# Patient Record
Sex: Male | Born: 1950 | Race: Black or African American | Hispanic: No | Marital: Married | Smoking: Never smoker
Health system: Southern US, Community
[De-identification: ages and names within clinical notes are randomized; demographics above are authoritative.]

## PROBLEM LIST (undated history)

## (undated) DIAGNOSIS — E119 Type 2 diabetes mellitus without complications: Secondary | ICD-10-CM

## (undated) DIAGNOSIS — I10 Essential (primary) hypertension: Secondary | ICD-10-CM

---

## 1999-03-29 ENCOUNTER — Encounter: Payer: Self-pay | Admitting: *Deleted

## 1999-03-29 ENCOUNTER — Inpatient Hospital Stay (HOSPITAL_COMMUNITY): Admission: EM | Admit: 1999-03-29 | Discharge: 1999-03-31 | Payer: Self-pay | Admitting: Emergency Medicine

## 1999-03-30 ENCOUNTER — Encounter: Payer: Self-pay | Admitting: Internal Medicine

## 2003-05-15 ENCOUNTER — Emergency Department (HOSPITAL_COMMUNITY): Admission: EM | Admit: 2003-05-15 | Discharge: 2003-05-15 | Payer: Self-pay | Admitting: *Deleted

## 2004-06-03 ENCOUNTER — Ambulatory Visit: Payer: Self-pay | Admitting: Internal Medicine

## 2004-06-04 ENCOUNTER — Emergency Department (HOSPITAL_COMMUNITY): Admission: EM | Admit: 2004-06-04 | Discharge: 2004-06-04 | Payer: Self-pay | Admitting: Family Medicine

## 2005-04-03 ENCOUNTER — Emergency Department (HOSPITAL_COMMUNITY): Admission: EM | Admit: 2005-04-03 | Discharge: 2005-04-04 | Payer: Self-pay | Admitting: Emergency Medicine

## 2005-04-22 ENCOUNTER — Emergency Department (HOSPITAL_COMMUNITY): Admission: EM | Admit: 2005-04-22 | Discharge: 2005-04-22 | Payer: Self-pay | Admitting: Family Medicine

## 2005-12-28 ENCOUNTER — Inpatient Hospital Stay (HOSPITAL_COMMUNITY): Admission: EM | Admit: 2005-12-28 | Discharge: 2005-12-30 | Payer: Self-pay | Admitting: Emergency Medicine

## 2005-12-30 ENCOUNTER — Ambulatory Visit: Payer: Self-pay | Admitting: Cardiology

## 2005-12-30 ENCOUNTER — Encounter: Payer: Self-pay | Admitting: Cardiology

## 2006-08-14 ENCOUNTER — Encounter: Admission: RE | Admit: 2006-08-14 | Discharge: 2006-08-14 | Payer: Self-pay | Admitting: Family Medicine

## 2008-02-15 ENCOUNTER — Emergency Department (HOSPITAL_COMMUNITY): Admission: EM | Admit: 2008-02-15 | Discharge: 2008-02-15 | Payer: Self-pay | Admitting: Emergency Medicine

## 2008-05-16 ENCOUNTER — Emergency Department (HOSPITAL_COMMUNITY): Admission: EM | Admit: 2008-05-16 | Discharge: 2008-05-16 | Payer: Self-pay | Admitting: Emergency Medicine

## 2009-02-02 ENCOUNTER — Emergency Department: Payer: Self-pay | Admitting: Emergency Medicine

## 2009-12-18 ENCOUNTER — Emergency Department (HOSPITAL_COMMUNITY): Admission: EM | Admit: 2009-12-18 | Discharge: 2009-12-18 | Payer: Self-pay | Admitting: Emergency Medicine

## 2010-04-17 ENCOUNTER — Observation Stay: Payer: Self-pay | Admitting: Internal Medicine

## 2010-07-19 NOTE — Discharge Summary (Signed)
Jeremy Pacheco. [facility]  Patient:    Jeremy Pacheco, Jeremy Pacheco                         MRN: Adm. Date:  03/29/99 Disc. Date: 03/31/99 Attending:  Champ Pacheco. Jeremy Pacheco, M.D. [facility] CC:         Jeremy Pacheco. Jeremy Pacheco, M.D. [facility]                           Discharge Summary  PROCEDURE:  Exercise Cardiolite.  REASON FOR ADMISSION:  Mr. Jeremy Pacheco is a 60 year old male with reported history of  previous "small" MIs with previous cardiac catheterization in 1997 in [place]. is cardiac risk factors were notable FH of premature CAD and HTN.  He presented with gradual onset of chest pain with reported improvement with sublingual nitroglycerin.  The pain was described as a "pressure" with associated nausea and diaphoresis, but no dyspnea.  An EKG on admission was notable for a first degree AVB, but otherwise, within normal limits.  He was admitted for a rule-out of MI and further diagnostic evaluation.  LABORATORY DATA:  Cardiac enzymes:  CPK/MB negative x 2; Troponin I 0.04 (x 2).  Metabolic profile and CBC within normal limits.  Admission CXR:  NAD.  HOSPITAL COURSE:  Following admission, the patient ruled-out for MI with negative serial cardiac enzymes.  The plan was to proceed with exercise stress testing.  Prior to performing the stress test, however, the patient was noted to have developed second degree AVB type I overnight on Lopressor.  This was discontinued and no recurrent episodes were noted.  However, the patient did continue to maintain sinus bradycardia in the 50-60 BP range.  Exercise Cardiolite was performed, and the patient achieved 86% of PMHR; 14.3 METS. No chest pain was elicited during the procedure with the patient exercising a total of 12 minutes and 30 seconds of standard Bruce protocol.  Blood pressure rose from 166/110 baseline to 190/100 maximum.  No significant ST changes were noted, nor any ventricular ectopy.  Subsequent review of images revealed no ischemia  with question of minimal inferior attenuation/scar; EF 45%.  FINAL RECOMMENDATIONS AT THE TIME OF DISCHARGE:  Initiate low-dose Altace for management of HTN, in conjunction with home dose Adalat 30 mg q.d.  Proceed with outpatient 2-D echocardiogram for follow up of LVF.  Of note, the patient did have normal hemoglobin/hematocrit, but he did have low  MCV (69).  He will need outpatient followup of this microcytosis.  DISCHARGE MEDICATIONS: 1. Adalat XL 30 mg q.d. 2. Altace 2.5 mg q.d. 3. Coated aspirin 325 mg q.d. 4. Indocin 25 mg t.i.d. p.r.n.  DISCHARGE DIET:  The patient was instructed to maintain a low-fat/low-cholesterol diet.  FOLLOWUP:  He will need a followup BMP in one week given initiation of Altace.  The patient is instructed to schedule followup appointments with his primary care physician, Dr. Lemmie Pacheco. Fiery, in the following 2-3 weeks.  Additionally, he is to call and schedule a followup appointment with Dr. Beckie Pacheco in approximately 4 weeks. He will need an outpatient 2-D echocardiogram.  DISCHARGE DIAGNOSES: 1. Nonischemic chest pain.    a. Exercise Cardiolite on March 30, 1999.    b. History of remote myocardial infarction. 2. Arrhythmia.  Second degree A-V block type I Jeremy Pacheco), Lopressor. 3. Hypertension. 4. Family history of premature coronary artery disease. 5. Microcytosis. DD:  03/31/99 TD:  03/31/99 Job: [id number]

## 2010-07-19 NOTE — H&P (Signed)
NAME:  Jeremy Pacheco, Jeremy Pacheco                ACCOUNT NO.:  [id number]   MEDICAL RECORD NO.:  [id number]          PATIENT TYPE:  INP   LOCATION:  1410                         FACILITY:  [facility]   PHYSICIAN:  Sherryl Manges, M.D.  DATE OF BIRTH:  [birth year]   DATE OF ADMISSION:  12/28/2005  DATE OF DISCHARGE:                                HISTORY & PHYSICAL   CHIEF COMPLAINT:  Fatigue, increased thirst and urinary frequency x2 weeks,  also blurred vision x2 days.   HISTORY OF PRESENT ILLNESS:  This is a 60 year old male. For past medical  history, see below.  According to the patient, for the past 2 weeks, he has  felt very thirsty and has had increased urinary frequency, possible dysuria  and also felt very weak and tired.  He has an occasional dry cough, denies  chest pain or fever.  On December 27, 2005, his vision became quite blurred  and his tongue felt swollen.  He discussed this with his brother who has  diabetes, and his brother suggested that he go to the emergency department,  which he did on December 28, 2005.  On arrival in the ED, his blood glucose  was found to be 804.   PAST MEDICAL HISTORY:  1. Coronary artery disease, status post MI at age 89 years, i.e. 31,      status post PCI and stent.  2. Status post negative exercise Cardiolite in January 2001 (Dr. Cristopher Peru).  3. Arrhythmia, with second degree heart block ([place]) in January      2001.  4. Hypertension.  5. Microcytosis.  6. CHF in 1995, in [place], [place].  7. Dyslipidemia.   MEDICATIONS:  1. Clonidine 0.2 mg p.o. b.i.d.  2. Enalapril 20 mg p.o. b.i.d.  3. Aspirin 81 mg p.o. daily.   ALLERGIES:  NO KNOWN DRUG ALLERGIES.   SOCIAL HISTORY:  The patient used to work as a Training and development officer, however, he is now  disabled, secondary to coronary artery disease and ischemic cardiomyopathy.  He is married and has five offspring.  He has three brothers, one of whom  has coronary artery disease, status post CABG  with four-vessel disease,  another is diabetic, the third is healthy.  He also has two sisters, both of  whom have diabetes mellitus and hypertension.  The patient is a nonsmoker,  drinks alcohol only very occasionally.  He has no history of drug abuse.  His mother is deceased at age 55 years, status post CVA.  His father died  status post MI, at age 40 years.   PHYSICAL EXAMINATION:  VITALS:  Temperature 98.2, pulse 71 per minute and  regular, respiratory 18, BP 152/92 mmHg, pulse oximeter 100% on room air.  GENERAL:  The patient appears quite comfortable, not in obvious acute  distress, alert, communicative, not short of breath at rest.  HEENT:  No clinical pallor or jaundice.  No conjunctival injection.  Throat  is quite dry.  NECK:  Neck is supple.  JVP not seen.  No palpable lymphadenopathy.  No  palpable goiter.  CHEST:  Clinically clear to auscultation.  No wheezes or crackles.  HEART:  Sounds 1 and 2 heard.  Normal, regular, no murmurs.  ABDOMEN:  Full, soft and nontender.  There is no palpable organomegaly.  No  palpable masses.  Normal bowel sounds.  EXTREMITIES:  Lower extremity examination.  No pitting edema.  Palpable  peripheral pulses.  MUSCULOSKELETAL:  Unremarkable.  NEUROLOGIC:  No focal neurologic deficits on gross examination.   LABORATORY DATA AND X-RAY FINDINGS:  CBC with WBC 5.2, hemoglobin 16.1,  hematocrit 50.4, platelets 204, MCV 72.5.  Electrolytes with sodium 130,  potassium 5.2, chloride 92, CO2 24, BUN 22, creatinine 1.4, glucose 804.  ABG on room air with pH 7.373, pCO2 37.0, pO2 97.3, HCO3 21.1.  Urinalysis  is negative.   ASSESSMENT AND PLAN:  1. Uncontrolled diabetes mellitus.  New diagnosis.  We shall admit the      patient for close monitoring, institute Glucommander protocol for now,      and transition to sliding scale insulin and possibly scheduled insulin,      when glycemia is controlled. For completeness, will do septic screen,       including blood cultures and chest x-ray.  Urinalysis is negative.  We      shall also do HbA1c.   1. Hypertension.  This is uncontrolled.  We shall continue pre-admission      antihypertensives, and monitor.   1. History of congestive heart failure.  Certainly, the patient is not      clinically in congestive heart failure at this time.   1. History of coronary artery disease.  This is asymptomatic, however, we      shall do EKG and cycle cardiac enzymes for completeness.   1. Dyslipidemia.  Check lipid profile and TSH.   Further management will depend on clinical course.      Sherryl Manges, M.D.  Electronically Signed     CO/MEDQ  D:  12/28/2005  T:  12/29/2005  Job:  [id number]   cc:   Vickki Hearing, M.D.  Fax: [phone]

## 2010-07-19 NOTE — Discharge Summary (Signed)
NAME:  Jeremy Pacheco, Jeremy Pacheco                ACCOUNT NO.:  [id number]   MEDICAL RECORD NO.:  [id number]          PATIENT TYPE:  INP   LOCATION:  [place]                         FACILITY:  [facility]   PHYSICIAN:  Jeremy Faster, MD        DATE OF BIRTH:  1950-08-13   DATE OF ADMISSION:  12/28/2005  DATE OF DISCHARGE:  12/30/2005                                 DISCHARGE SUMMARY   PRIMARY CARE PHYSICIAN:  Jeremy Pacheco, M.D.   HISTORY OF PRESENT ILLNESS:  Jeremy Pacheco is a 60 year old gentleman with a  past history significant for coronary artery disease, status post myocardial  infarction in 1997, status post stent, hypertension, history of congestive  heart failure in 1995 and dyslipidemia, who was admitted with fatigue and  increased urinary frequency for the last couple of weeks.  On the day of  admission, the patient's vision became blurred and he discussed this with  his brother who has diabetes and he asked him to come to the ER.  In the ER,  his blood sugar was found to be 804 on presentation.   HOSPITAL COURSE:  1. New onset diabetes mellitus.  This is a gentleman who did not have a      past history of diabetes but with strong family history of diabetes      mellitus who comes in with new onset of diabetes.  His blood sugar on      presentation was elevated at 804.  He did not have anion gap.  He was      not  acidotic on admission.  He was started on the insulin drip      protocol to bring his blood sugars down and then he was started on      Lantus of 10 units daily.  His blood sugars have been controlled.  I      suspect that he has type 2 diabetes mellitus considering his age but I      also suspect that he may need insulin for a short term to control his      blood sugars because I suspect he may be insulin deficit from his      pancreas being burned out.  We are planning to discharge him on Lantus      of 10 units at bedtime along with the Glucophage which was started in      the hospital.   He was also told to check his blood sugars three times a      day and make a note of it and take it with him when he sees Dr. Juventino Pacheco.      He was given diabetic education while he was in the hospital and he is      being referred to outpatient diabetic treatment program.  We can      consider doing the antibody against insulin as an outpatient if needed      to make the diagnosis.  2. Hypertension.  His blood pressure has been controlled.  He put him back  on his home medications.  3. History of coronary artery disease.  He does not have any chest pain or      any breathing difficulty during this admission.   DISCHARGE MEDICATIONS:  1. Lantus 10 units subcu at bedtime.  2. Glucophage 500 mg twice daily.  3. Clonidine 0.2 mg twice daily.  4. Enalapril 20 mg twice daily.  5. Aspirin 81 mg.   DISPOSITION:  He will be discharged home in a stable condition.   FOLLOWUP:  He will follow up with his primary care doctor, Dr. Ihor Gully,  in one week.  He will be referred to outpatient diabetic treatment program.  He was taught how to take the insulin shots while he was in the hospital and  he was also given diabetic education by a dietician.  He was also  recommended to take low calorie diet by dietician.      Jeremy Faster, MD  Electronically Signed     PKN/MEDQ  D:  12/30/2005  T:  12/30/2005  Job:  [id number]   cc:   Jeremy Pacheco, M.D.  Fax: [phone]

## 2011-10-02 DEATH — deceased

## 2012-05-27 ENCOUNTER — Emergency Department: Payer: Self-pay | Admitting: Emergency Medicine

## 2012-05-27 LAB — COMPREHENSIVE METABOLIC PANEL
Alkaline Phosphatase: 62 U/L (ref 50–136)
Anion Gap: 6 — ABNORMAL LOW (ref 7–16)
BUN: 15 mg/dL (ref 7–18)
Calcium, Total: 9.1 mg/dL (ref 8.5–10.1)
Chloride: 105 mmol/L (ref 98–107)
Co2: 25 mmol/L (ref 21–32)
EGFR (Non-African Amer.): >60
Glucose: 82 mg/dL (ref 65–99)
Osmolality: 272 (ref 275–301)
SGOT(AST): 21 U/L (ref 15–37)
SGPT (ALT): 25 U/L (ref 12–78)
Sodium: 136 mmol/L (ref 136–145)
Total Protein: 8.4 g/dL — ABNORMAL HIGH (ref 6.4–8.2)

## 2012-05-27 LAB — CBC
HCT: 40.9 % (ref 40.0–52.0)
HGB: 13.2 g/dL (ref 13.0–18.0)
MCH: 22.8 pg — ABNORMAL LOW (ref 26.0–34.0)
MCHC: 32.3 g/dL (ref 32.0–36.0)
MCV: 71 fL — ABNORMAL LOW (ref 80–100)
Platelet: 248 10*3/uL (ref 150–440)
RBC: 5.79 10*6/uL (ref 4.40–5.90)
RDW: 15 % — ABNORMAL HIGH (ref 11.5–14.5)
WBC: 3.9 10*3/uL (ref 3.8–10.6)

## 2012-05-27 LAB — TROPONIN I: Troponin-I: <0.02 ng/mL

## 2012-06-03 ENCOUNTER — Emergency Department: Payer: Self-pay | Admitting: Emergency Medicine

## 2014-04-27 ENCOUNTER — Emergency Department: Payer: Self-pay | Admitting: Emergency Medicine

## 2018-08-29 ENCOUNTER — Other Ambulatory Visit: Payer: Self-pay

## 2018-08-29 ENCOUNTER — Ambulatory Visit (HOSPITAL_COMMUNITY)
Admission: EM | Admit: 2018-08-29 | Discharge: 2018-08-29 | Disposition: A | Discharge status: Home / Self Care | Payer: Medicare Other

## 2018-08-29 ENCOUNTER — Encounter (HOSPITAL_COMMUNITY): Payer: Self-pay | Admitting: *Deleted

## 2018-08-29 DIAGNOSIS — R109 Unspecified abdominal pain: Secondary | ICD-10-CM

## 2018-08-29 HISTORY — DX: Type 2 diabetes mellitus without complications: E11.9

## 2018-08-29 HISTORY — DX: Essential (primary) hypertension: I10

## 2018-08-29 LAB — POCT URINALYSIS DIP (DEVICE)
Bilirubin Urine: NEGATIVE
Glucose, UA: 100 mg/dL — AB
Ketones, ur: NEGATIVE mg/dL
Leukocytes,Ua: NEGATIVE
Nitrite: NEGATIVE
Protein, ur: 30 mg/dL — AB
Specific Gravity, Urine: 1.025 (ref 1.005–1.030)
Urobilinogen, UA: 0.2 mg/dL (ref 0.0–1.0)
pH: 7 (ref 5.0–8.0)

## 2018-08-29 MED ORDER — INDOMETHACIN 50 MG PO CAPS: 50.0000 mg | ORAL_CAPSULE | Freq: Two times a day (BID) | ORAL | 0 refills | Status: DC

## 2018-08-29 MED ORDER — KETOROLAC TROMETHAMINE 30 MG/ML IJ SOLN
INTRAMUSCULAR | Status: AC
  Filled 2018-08-29: qty 1

## 2018-08-29 MED ORDER — KETOROLAC TROMETHAMINE 30 MG/ML IJ SOLN
30.0000 mg | Freq: Once | INTRAMUSCULAR | Status: AC
  Administered 2018-08-29: 30 mg via INTRAMUSCULAR

## 2018-08-29 MED ORDER — TAMSULOSIN HCL 0.4 MG PO CAPS: 0.4000 mg | ORAL_CAPSULE | Freq: Every day | ORAL | 0 refills | Status: AC

## 2018-08-29 NOTE — Discharge Instructions (Signed)
Your urine did show blood which is suggestive of a kidney stone contributing to your back pain. Please strain all your urine to catch any stone We gave you a shot of Toradol to help with your pain prior to leaving Continue to use indomethacin twice a day, do not take until tonight since we gave you a shot Take tamsulosin/Flomax daily to help open up ureter to pass any stone  Please also be sure to drink plenty of fluids to flush any stone and help with any underlying constipation Incorporating fiber into diet  Please follow-up if developing worsening pain, fever, vomiting, not passing any stone in 4 to 5 days, symptoms worsening

## 2018-08-29 NOTE — ED Triage Notes (Signed)
Reports starting with right abd pain yesterday; took Fleets enema for constipation.  Has had adequate BM.  States constant pain has now wrapped around to right flank area.  Denies n//v, fevers.  Denies injury.

## 2018-08-30 NOTE — ED Provider Notes (Signed)
[place]    CSN: [id number] Arrival date & time: 08/29/18  1201      History   Chief Complaint Chief Complaint  Patient presents with  . Flank Pain    HPI Jeremy Pacheco is a 68 y.o. male history of DM type II, hypertension, presenting today for evaluation of right-sided flank pain.  Patient states that his symptoms began yesterday.  Initially attributed this to constipation.  He took a fleets enema had a normal sized bowel movement.  States pain did not improve with this.  States that he has regular bowel movements and denies straining.  He has had not had associated nausea vomiting or fevers.  Denies injury or increase in activity that may have triggered pain.  He is concerned about kidney stone.  Denies previous history of kidney stones.  Denies any change in urinary symptoms.  Denies increased frequency, urgency, hematuria or dysuria.  HPI  Past Medical History:  Diagnosis Date  . Diabetes mellitus without complication ([place])   . Hypertension     There are no active problems to display for this patient.   History reviewed. No pertinent surgical history.     Home Medications    Prior to Admission medications   Medication Sig Start Date End Date Taking? Authorizing Provider  AMLODIPINE BENZOATE PO Take by mouth.   Yes [provider]  LOSARTAN POTASSIUM-HCTZ PO Take by mouth.   Yes [provider]  METFORMIN HCL PO Take by mouth.   Yes [provider]  indomethacin (INDOCIN) 50 MG capsule Take 1 capsule (50 mg total) by mouth 2 (two) times daily with a meal. 08/29/18   Juandedios Dudash C, PA-C  tamsulosin (FLOMAX) 0.4 MG CAPS capsule Take 1 capsule (0.4 mg total) by mouth daily for 10 days. 08/29/18 09/08/18  Sotiria Keast, Elesa Hacker, PA-C    Family History Family History  Problem Relation Age of Onset  . Heart disease Mother   . Diabetes Mother   . Heart disease Father   . Diabetes Father     Social History Social History    Tobacco Use  . Smoking status: Never Smoker  . Smokeless tobacco: Never Used  Substance Use Topics  . Alcohol use: Not Currently    Frequency: Never  . Drug use: Never     Allergies   Patient has no known allergies.   Review of Systems Review of Systems  Constitutional: Negative for fever.  HENT: Negative for sore throat.   Respiratory: Negative for shortness of breath.   Cardiovascular: Negative for chest pain.  Gastrointestinal: Negative for abdominal pain, constipation, diarrhea, nausea and vomiting.  Genitourinary: Positive for flank pain. Negative for difficulty urinating, discharge, dysuria, frequency, penile pain, penile swelling, scrotal swelling and testicular pain.  Skin: Negative for rash.  Neurological: Negative for dizziness, light-headedness and headaches.     Physical Exam Triage Vital Signs ED Triage Vitals  Enc Vitals Group     BP 08/29/18 1318 (!) 187/86     Pulse Rate 08/29/18 1318 (!) 59     Resp 08/29/18 1318 20     Temp 08/29/18 1318 98.9 F (37.2 C)     Temp Source 08/29/18 1318 Oral     SpO2 08/29/18 1318 99 %     Weight --      Height --      Head Circumference --      Peak Flow --      Pain Score 08/29/18 1320 10  Pain Loc --      Pain Edu? --      Excl. in [place]? --    No data found.  Updated Vital Signs BP (!) 187/86   Pulse (!) 59   Temp 98.9 F (37.2 C) (Oral)   Resp 20   SpO2 99%   Visual Acuity Right Eye Distance:   Left Eye Distance:   Bilateral Distance:    Right Eye Near:   Left Eye Near:    Bilateral Near:     Physical Exam Vitals signs and nursing note reviewed.  Constitutional:      Appearance: He is well-developed.  HENT:     Head: Normocephalic and atraumatic.     Mouth/Throat:     Comments: Oral mucosa pink and moist, no tonsillar enlargement or exudate. Posterior pharynx patent and nonerythematous, no uvula deviation or swelling. Normal phonation.  Eyes:     Conjunctiva/sclera: Conjunctivae normal.   Neck:     Musculoskeletal: Neck supple.  Cardiovascular:     Rate and Rhythm: Normal rate and regular rhythm.     Heart sounds: No murmur.  Pulmonary:     Effort: Pulmonary effort is normal. No respiratory distress.     Breath sounds: Normal breath sounds.     Comments: Breathing comfortably at rest, CTABL, no wheezing, rales or other adventitious sounds auscultated Abdominal:     Palpations: Abdomen is soft.     Tenderness: There is no abdominal tenderness.     Comments: Soft, nondistended, nontender to light and deep palpation throughout entire abdomen  Musculoskeletal:     Comments: Nontender to palpation of cervical, thoracic and lumbar spine midline, nonreproducible pain to palpation of bilateral lumbar and lower thoracic musculature, no CVA tenderness bilaterally  Skin:    General: Skin is warm and dry.  Neurological:     Mental Status: He is alert.      UC Treatments / Results  Labs (all labs ordered are listed, but only abnormal results are displayed) Labs Reviewed  POCT URINALYSIS DIP (DEVICE) - Abnormal; Notable for the following components:      Result Value   Glucose, UA 100 (*)    Hgb urine dipstick SMALL (*)    Protein, ur 30 (*)    All other components within normal limits    EKG None  Radiology No results found.  Procedures Procedures (including critical care time)  Medications Ordered in UC Medications  ketorolac (TORADOL) 30 MG/ML injection 30 mg (30 mg Intramuscular Given 08/29/18 1420)  ketorolac (TORADOL) 30 MG/ML injection (has no administration in time range)    Initial Impression / Assessment and Plan / UC Course  I have reviewed the triage vital signs and the nursing notes.  Pertinent labs & imaging results that were available during my care of the patient were reviewed by me and considered in my medical decision making (see chart for details).     Patient does have small hemoglobin on UA, no previous history of kidney stone.  Offered  x-ray of abdomen to check for possible constipation versus radiopaque stone.  Discussed not all stones are visible on x-ray.  Patient declined this and wished to proceed with treatment for kidney stone with continued monitoring.  Provided Toradol for pain prior to discharge.  Flomax daily, strainer provided advised to strain all urine.  May continue with indomethacin at home.  Drink plenty of fluids.  Follow-up if pain not resolving or worsening or changing.Discussed strict return precautions. Patient verbalized understanding  and is agreeable with plan.  Final Clinical Impressions(s) / UC Diagnoses   Final diagnoses:  Right flank pain     Discharge Instructions     Your urine did show blood which is suggestive of a kidney stone contributing to your back pain. Please strain all your urine to catch any stone We gave you a shot of Toradol to help with your pain prior to leaving Continue to use indomethacin twice a day, do not take until tonight since we gave you a shot Take tamsulosin/Flomax daily to help open up ureter to pass any stone  Please also be sure to drink plenty of fluids to flush any stone and help with any underlying constipation Incorporating fiber into diet  Please follow-up if developing worsening pain, fever, vomiting, not passing any stone in 4 to 5 days, symptoms worsening   ED Prescriptions    Medication Sig Dispense Auth. Provider   tamsulosin (FLOMAX) 0.4 MG CAPS capsule Take 1 capsule (0.4 mg total) by mouth daily for 10 days. 10 capsule Lerlene Treadwell C, PA-C   indomethacin (INDOCIN) 50 MG capsule Take 1 capsule (50 mg total) by mouth 2 (two) times daily with a meal. 16 capsule Casey Fye C, PA-C     Controlled Substance Prescriptions [place] Controlled Substance Registry consulted? Not Applicable   Janith L[place], [place] 08/30/18 1729

## 2019-09-22 ENCOUNTER — Other Ambulatory Visit: Payer: Self-pay

## 2019-09-22 ENCOUNTER — Encounter (HOSPITAL_COMMUNITY): Payer: Self-pay

## 2019-09-22 ENCOUNTER — Ambulatory Visit (HOSPITAL_COMMUNITY)
Admission: EM | Admit: 2019-09-22 | Discharge: 2019-09-22 | Disposition: A | Discharge status: Home / Self Care | Payer: Medicare Other | Attending: Emergency Medicine | Admitting: Emergency Medicine

## 2019-09-22 NOTE — ED Triage Notes (Signed)
Pt presents with right side rib & flank pain after a fall of about 6 ft off of his ladder at home today; pt states it hurts when he breathes in & out and move around & is unrelieved with OTC medication.

## 2019-09-23 ENCOUNTER — Ambulatory Visit (HOSPITAL_COMMUNITY): Payer: Self-pay

## 2020-05-04 LAB — EXTERNAL GENERIC LAB PROCEDURE

## 2020-05-23 LAB — EXTERNAL GENERIC LAB PROCEDURE

## 2021-04-18 LAB — EXTERNAL GENERIC LAB PROCEDURE

## 2021-05-03 ENCOUNTER — Other Ambulatory Visit: Payer: Self-pay | Admitting: Nephrology

## 2021-05-03 DIAGNOSIS — E1122 Type 2 diabetes mellitus with diabetic chronic kidney disease: Secondary | ICD-10-CM

## 2021-05-03 DIAGNOSIS — I129 Hypertensive chronic kidney disease with stage 1 through stage 4 chronic kidney disease, or unspecified chronic kidney disease: Secondary | ICD-10-CM

## 2021-05-03 DIAGNOSIS — R3129 Other microscopic hematuria: Secondary | ICD-10-CM

## 2021-05-03 DIAGNOSIS — N184 Chronic kidney disease, stage 4 (severe): Secondary | ICD-10-CM

## 2021-05-03 DIAGNOSIS — N200 Calculus of kidney: Secondary | ICD-10-CM

## 2021-05-03 DIAGNOSIS — I2510 Atherosclerotic heart disease of native coronary artery without angina pectoris: Secondary | ICD-10-CM

## 2021-05-03 DIAGNOSIS — D631 Anemia in chronic kidney disease: Secondary | ICD-10-CM

## 2021-05-03 DIAGNOSIS — N189 Chronic kidney disease, unspecified: Secondary | ICD-10-CM

## 2021-05-07 ENCOUNTER — Other Ambulatory Visit: Payer: Medicare Other

## 2021-05-09 ENCOUNTER — Other Ambulatory Visit: Payer: Self-pay | Admitting: Nephrology

## 2021-05-09 ENCOUNTER — Other Ambulatory Visit (HOSPITAL_COMMUNITY): Payer: Self-pay | Admitting: Nephrology

## 2021-05-10 ENCOUNTER — Ambulatory Visit
Admission: RE | Admit: 2021-05-10 | Discharge: 2021-05-10 | Disposition: A | Discharge status: Home / Self Care | Payer: Medicare Other | Source: Ambulatory Visit | Attending: Nephrology | Admitting: Nephrology

## 2021-05-10 ENCOUNTER — Other Ambulatory Visit: Payer: Self-pay

## 2021-05-10 DIAGNOSIS — D631 Anemia in chronic kidney disease: Secondary | ICD-10-CM

## 2021-05-10 DIAGNOSIS — N200 Calculus of kidney: Secondary | ICD-10-CM

## 2021-05-10 DIAGNOSIS — I129 Hypertensive chronic kidney disease with stage 1 through stage 4 chronic kidney disease, or unspecified chronic kidney disease: Secondary | ICD-10-CM

## 2021-05-10 DIAGNOSIS — N184 Chronic kidney disease, stage 4 (severe): Secondary | ICD-10-CM

## 2021-05-10 DIAGNOSIS — R3129 Other microscopic hematuria: Secondary | ICD-10-CM

## 2021-05-10 DIAGNOSIS — E1122 Type 2 diabetes mellitus with diabetic chronic kidney disease: Secondary | ICD-10-CM

## 2021-05-10 DIAGNOSIS — I2510 Atherosclerotic heart disease of native coronary artery without angina pectoris: Secondary | ICD-10-CM

## 2021-05-10 DIAGNOSIS — N189 Chronic kidney disease, unspecified: Secondary | ICD-10-CM

## 2021-05-20 ENCOUNTER — Encounter (HOSPITAL_COMMUNITY)
Admission: RE | Admit: 2021-05-20 | Discharge: 2021-05-20 | Disposition: A | Discharge status: Home / Self Care | Payer: Medicare Other | Source: Ambulatory Visit | Attending: Nephrology | Admitting: Nephrology

## 2021-05-20 ENCOUNTER — Other Ambulatory Visit: Payer: Self-pay

## 2021-05-20 MED ORDER — TECHNETIUM TC 99M SESTAMIBI GENERIC - CARDIOLITE
25.2000 | Freq: Once | INTRAVENOUS | Status: AC | PRN
  Administered 2021-05-20: 25.2 via INTRAVENOUS

## 2021-06-05 ENCOUNTER — Ambulatory Visit: Payer: Self-pay | Admitting: Urology

## 2021-06-25 NOTE — Progress Notes (Addendum)
? ?06/26/2021 ?8:19 AM  ? ?Jeremy Pacheco ?August 06, 1950 ?[id number] ? ?Referring provider:  ?Bernerd Limbo, MD ?[address] ?[address] ?[place],  [place] [address] ?Chief Complaint  ?Patient presents with  ? Nephrolithiasis  ? ? ?HPI: ?Jeremy Pacheco is a 71 y.o.male who presents today for further evaluation of nephrolithiasis and right hydronephrosis. ? ?His creatinine has been trending up since 2019. In 2019 his creatinine was 1.38, it increased in 2020 to 1.83 then decreased back down to 1.38. It trended back up to 1.90 in 2021 then to 2.17 in 2022. His most recent creatinine was 2.60 on 04/04/2021.   ? ?He underwent a RUS on 05/10/2021 to further evaluate CKD. It visualized moderate to marked right hydronephrosis along with cortical thinning, possibly suggesting longstanding obstruction of right ureter. There is 8 mm calcific density in the urinary bladder which may suggest recently passed ureteral calculus or calculus at the right ureterovesical junction. ? ?He had a previous CT abdomen and pelvis on 2020 that visualized nonobstructing right renal calculi, small right cortical renal cysts, incompletely distended urinary bladder with nonspecific mild-to-moderate wall thickening, diverticulosis without evidence of acute diverticulitis.  ? ?UA today shows few red blood cells and white blood cells.  ? ?He reports that he has never had urinary issues he was told in ~2020 that he had kidney stones at time he had  flank pain. He also reports that he had a cystoscopy for blood in his urine and was told he had BPH in [place]. He has a little bit of urinary frequency but no other urinary symptoms.   ? ?PMH: ?Past Medical History:  ?Diagnosis Date  ? Diabetes mellitus without complication ([place])   ? Hypertension   ? ? ?Surgical History: ?No past surgical history on file. ? ?Home Medications:  ?Allergies as of 06/26/2021   ? ?   Reactions  ? Penicillins Hives  ? childhood  ? Lisinopril Cough  ? ?  ? ?  ?Medication List  ?   ? ?  ? Accurate as of June 26, 2021 11:59 PM. If you have any questions, ask your nurse or doctor.  ?  ?  ? ?  ? ?STOP taking these medications   ? ?LOSARTAN POTASSIUM-HCTZ PO ?Stopped by: Hollice Espy, MD ?  ? ?  ? ?TAKE these medications   ? ?AMLODIPINE BENZOATE PO ?Take by mouth. ?  ?indomethacin 50 MG capsule ?Commonly known as: INDOCIN ?Take 1 capsule (50 mg total) by mouth 2 (two) times daily with a meal. ?  ?METFORMIN HCL PO ?Take by mouth. ?  ? ?  ? ? ?Allergies:  ?Allergies  ?Allergen Reactions  ? Penicillins Hives  ?  childhood  ? Lisinopril Cough  ? ? ?Family History: ?Family History  ?Problem Relation Age of Onset  ? Heart disease Mother   ? Diabetes Mother   ? Heart disease Father   ? Diabetes Father   ? ? ?Social History:  reports that he has never smoked. He has never used smokeless tobacco. He reports that he does not currently use alcohol. He reports that he does not use drugs. ? ? ?Physical Exam: ?BP (!) 174/106   Pulse 68   Ht 5\' 10"  (1.778 m)   Wt 204 lb (92.5 kg)   BMI 29.27 kg/m?   ?Constitutional:  Alert and oriented, No acute distress. ?HEENT: [place] AT, moist mucus membranes.  Trachea midline, no masses. ?Cardiovascular: No clubbing, cyanosis, or edema. ?Respiratory: Normal respiratory effort, no increased  work of breathing. ?Skin: No rashes, bruises or suspicious lesions. ?Neurologic: Grossly intact, no focal deficits, moving all 4 extremities. ?Psychiatric: Normal mood and affect. ? ?Laboratory Data: ?Lab Results  ?Component Value Date  ? CREATININE 1.28 05/27/2012  ? ? ?Urinalysis ?Few WBCs and Few RBCs.  ? ?Pertinent Imaging: ?CLINICAL DATA:  Chronic kidney disease ?  ?EXAM: ?RENAL / URINARY TRACT ULTRASOUND COMPLETE ?  ?COMPARISON:  None. ?  ?FINDINGS: ?Right Kidney: ?  ?Renal measurements: 11.9 x 5.7 x 6.7 = volume: 237 mL. There is ?moderate to marked hydronephrosis. There is cortical thinning. There ?is 1.9 cm cyst in the upper pole. Ureters are not adequately ?visualized for  evaluation. ?  ?Left Kidney: ?  ?Renal measurements: 11.4 x 6.7 x 5.4 cm = volume: 210.7 mL. There is ?no hydronephrosis. There is increased cortical echogenicity. ?  ?Bladder: ?  ?There is 8 mm calcific density in the posterior bladder with ?acoustic shadowing suggesting calculus in the lumen of the bladder ?or at the right ureterovesical junction. ?  ?Other: ?  ?None. ?  ?IMPRESSION: ?There is moderate to marked right hydronephrosis along with cortical ?thinning, possibly suggesting longstanding obstruction of right ?ureter. There is 8 mm calcific density in the urinary bladder which ?may suggest recently passed ureteral calculus or calculus at the ?right ureterovesical junction. ?  ?There is increased cortical echogenicity in the left kidney ?suggesting medical renal disease. ?  ?  ?Electronically Signed ?  By: Elmer Picker M.D. ?  On: 05/10/2021 20:51 ?  ?I have personally reviewed the images and agree with radiologist interpretation.  ? ? ?Assessment & Plan:   ? ?Nephrolithiasis / right hydronephrosis / right renal atrophy ?- Will further evaluate with CT renal stone study to better visualize if there is a stone burden.  ?-RUS concerning for longstanding obstruction ? ?2. Microscopic hematuria  ?- Likely secondary to #1 ?- Will send urine for culture to rule out any infections  ? ?3. CKD, stage 3 ?-likely has an obstructive component contributing to worsening renal function ?-discussed the importance of preservation of nephrons, depending on the chronicity of the obstruction which appears to be fairly long, in the last function in the kidney.  After the obstruction is relieved, we will may have to assess the overall function of the kidney with nuclear medicine scan. ? ?F/u Ct stone, will expedite  ? ?Conley Rolls as a scribe for Hollice Espy, MD.,have documented all relevant documentation on the behalf of Hollice Espy, MD,as directed by  Hollice Espy, MD while in the presence of Hollice Espy, MD. ? ?I have reviewed the above documentation for accuracy and completeness, and I agree with the above.  ? ?Hollice Espy, MD ? ? ?[facility] ?[address], [address] ?[place], [place] [address] ?[phone] ?

## 2021-06-26 ENCOUNTER — Encounter: Payer: Self-pay | Admitting: Urology

## 2021-06-26 ENCOUNTER — Ambulatory Visit: Payer: Medicare Other | Admitting: Urology

## 2021-06-26 VITALS — BP 174/106 | HR 68 | Ht 70.0 in | Wt 204.0 lb

## 2021-06-26 DIAGNOSIS — R1032 Left lower quadrant pain: Secondary | ICD-10-CM

## 2021-06-26 DIAGNOSIS — R3129 Other microscopic hematuria: Secondary | ICD-10-CM | POA: Diagnosis not present

## 2021-06-26 DIAGNOSIS — N200 Calculus of kidney: Secondary | ICD-10-CM

## 2021-06-26 DIAGNOSIS — N183 Chronic kidney disease, stage 3 unspecified: Secondary | ICD-10-CM | POA: Diagnosis not present

## 2021-06-26 LAB — URINALYSIS, COMPLETE
Bilirubin, UA: NEGATIVE
Ketones, UA: NEGATIVE
Leukocytes,UA: NEGATIVE
Nitrite, UA: NEGATIVE
Specific Gravity, UA: 1.025 (ref 1.005–1.030)
Urobilinogen, Ur: 0.2 mg/dL (ref 0.2–1.0)
pH, UA: 6 (ref 5.0–7.5)

## 2021-06-26 LAB — MICROSCOPIC EXAMINATION

## 2021-07-01 LAB — CULTURE, URINE COMPREHENSIVE

## 2021-07-02 ENCOUNTER — Telehealth: Payer: Self-pay | Admitting: *Deleted

## 2021-07-02 MED ORDER — NITROFURANTOIN MONOHYD MACRO 100 MG PO CAPS: 100.0000 mg | ORAL_CAPSULE | Freq: Two times a day (BID) | ORAL | 0 refills | Status: DC

## 2021-07-02 NOTE — Telephone Encounter (Addendum)
Patient informed, sent in RX to [facility]. Voiced understanding.  ? ?----- Message from Hollice Espy, MD sent at 07/01/2021  5:20 PM EDT ----- ?Very low colony count however in setting of possible urinary obstruction and abnormal urinalysis, I like to go ahead and treat this.  Please treated with nitrofurantoin 100 mg twice daily for 10 days. ? ?Hollice Espy, MD ? ?

## 2021-07-09 ENCOUNTER — Ambulatory Visit
Admission: RE | Admit: 2021-07-09 | Discharge: 2021-07-09 | Disposition: A | Discharge status: Home / Self Care | Payer: Medicare Other | Source: Ambulatory Visit | Attending: Urology | Admitting: Urology

## 2021-07-09 DIAGNOSIS — N200 Calculus of kidney: Secondary | ICD-10-CM | POA: Insufficient documentation

## 2021-07-09 DIAGNOSIS — R1032 Left lower quadrant pain: Secondary | ICD-10-CM | POA: Insufficient documentation

## 2021-07-10 ENCOUNTER — Other Ambulatory Visit: Payer: Self-pay | Admitting: Urology

## 2021-07-10 ENCOUNTER — Ambulatory Visit: Payer: Medicare Other | Admitting: Urology

## 2021-07-10 ENCOUNTER — Encounter: Payer: Self-pay | Admitting: Urology

## 2021-07-10 ENCOUNTER — Telehealth: Payer: Self-pay

## 2021-07-10 VITALS — BP 181/99 | HR 100 | Ht 70.0 in | Wt 204.0 lb

## 2021-07-10 DIAGNOSIS — N210 Calculus in bladder: Secondary | ICD-10-CM

## 2021-07-10 DIAGNOSIS — N261 Atrophy of kidney (terminal): Secondary | ICD-10-CM | POA: Diagnosis not present

## 2021-07-10 DIAGNOSIS — N202 Calculus of kidney with calculus of ureter: Secondary | ICD-10-CM

## 2021-07-10 DIAGNOSIS — N200 Calculus of kidney: Secondary | ICD-10-CM

## 2021-07-10 DIAGNOSIS — N289 Disorder of kidney and ureter, unspecified: Secondary | ICD-10-CM

## 2021-07-10 DIAGNOSIS — K5730 Diverticulosis of large intestine without perforation or abscess without bleeding: Secondary | ICD-10-CM

## 2021-07-10 DIAGNOSIS — N1330 Unspecified hydronephrosis: Secondary | ICD-10-CM

## 2021-07-10 DIAGNOSIS — N183 Chronic kidney disease, stage 3 unspecified: Secondary | ICD-10-CM

## 2021-07-10 DIAGNOSIS — N281 Cyst of kidney, acquired: Secondary | ICD-10-CM

## 2021-07-10 DIAGNOSIS — N189 Chronic kidney disease, unspecified: Secondary | ICD-10-CM

## 2021-07-10 NOTE — Patient Instructions (Signed)
Ureteroscopy ?Ureteroscopy is a procedure to check for and treat problems inside part of the urinary tract. In this procedure, a thin, flexible tube with a light at the end (ureteroscope) is used to look at the inside of the kidneys and the ureters. The ureters are the tubes that carry urine from the kidneys to the bladder. The ureteroscope is inserted into one or both of the ureters. ?You may need this procedure if you have frequent urinary tract infections (UTIs), blood in your urine, or a stone in one of your ureters. A ureteroscopy can be done: ?To find the cause of urine blockage in a ureter and to evaluate other abnormalities inside the ureters or kidneys. ?To remove stones. ?To remove or treat growths of tissue (polyps), abnormal tissue, and some types of tumors. ?To remove a tissue sample and check it for disease under a microscope (biopsy). ?Tell a health care provider about: ?Any allergies you have. ?All medicines you are taking, including vitamins, herbs, eye drops, creams, and over-the-counter medicines. ?Any problems you or family members have had with anesthetic medicines. ?Any blood disorders you have. ?Any surgeries you have had. ?Any medical conditions you have. ?Whether you are pregnant or may be pregnant. ?What are the risks? ?Generally, this is a safe procedure. However, problems may occur, including: ?Bleeding. ?Infection. ?Allergic reactions to medicines. ?Scarring that narrows the ureter (stricture). ?Creating a hole in the ureter (perforation). ?What happens before the procedure? ?Staying hydrated ?Follow instructions from your health care provider about hydration, which may include: ?Up to 2 hours before the procedure - you may continue to drink clear liquids, such as water, clear fruit juice, black coffee, and plain tea. ? ?Eating and drinking restrictions ?Follow instructions from your health care provider about eating and drinking, which may include: ?8 hours before the procedure - stop  eating heavy meals or foods, such as meat, fried foods, or fatty foods. ?6 hours before the procedure - stop eating light meals or foods, such as toast or cereal. ?6 hours before the procedure - stop drinking milk or drinks that contain milk. ?2 hours before the procedure - stop drinking clear liquids. ?Medicines ?Ask your health care provider about: ?Changing or stopping your regular medicines. This is especially important if you are taking diabetes medicines or blood thinners. ?Taking medicines such as aspirin and ibuprofen. These medicines can thin your blood. Do not take these medicines unless your health care provider tells you to take them. ?Taking over-the-counter medicines, vitamins, herbs, and supplements. ?General instructions ?Do not use any products that contain nicotine or tobacco for at least 4 weeks before the procedure. These products include cigarettes, e-cigarettes, and chewing tobacco. If you need help quitting, ask your health care provider. ?You may have a urine sample taken to check for infection. ?Plan to have someone take you home from the hospital or clinic. ?If you will be going home right after the procedure, plan to have someone with you for 24 hours. ?Ask your health care provider what steps will be taken to help prevent infection. These may include: ?Washing skin with a germ-killing soap. ?Receiving antibiotic medicine. ?What happens during the procedure? ? ?An IV will be inserted into one of your veins. ?You will be given one or more of the following: ?A medicine to help you relax (sedative). ?A medicine to make you fall asleep (general anesthetic). ?A medicine that is injected into your spine to numb the area below and slightly above the injection site (spinal anesthetic). ?The  part of your body that drains urine from your bladder (urethra) will be cleaned with a germ-killing solution. ?The ureteroscope will be passed through your urethra into your bladder. ?A salt-water solution will  be sent through the ureteroscope to fill your bladder. This will help the health care provider see the openings of your ureters more clearly. ?The ureteroscope will be passed into your ureter. ?If a growth is found, a biopsy may be done. ?If a stone is found, it may be removed through the ureteroscope, or the stone may be broken up using a laser, shock waves, or electrical energy. ?In some cases, if the ureter is too small, a tube may be inserted that keeps the ureter open (ureteral stent). The stent may be left in place for 1 or 2 weeks to keep the ureter open, and then the ureteroscopy procedure will be done. ?The scope will be removed, and your bladder will be emptied. ?The procedure may vary among health care providers and hospitals. ?What can I expect after the procedure? ?After your procedure, it is common to have: ?Your blood pressure, heart rate, breathing rate, and blood oxygen level monitored until you leave the hospital or clinic. ?A burning sensation when you urinate. You may be asked to urinate. ?Blood in your urine. ?Mild discomfort in your bladder area or kidney area when urinating. ?A need to urinate more often or urgently. ?Follow these instructions at home: ?Medicines ?Take over-the-counter and prescription medicines only as told by your health care provider. ?If you were prescribed an antibiotic medicine, take it as told by your health care provider. Do not stop taking the antibiotic even if you start to feel better. ?General instructions ? ?If you were given a sedative during the procedure, it can affect you for several hours. Do not drive or operate machinery until your health care provider says that it is safe. ?To relieve burning, take a warm bath or hold a warm washcloth over your groin. ?Drink enough fluid to keep your urine pale yellow. ?Drink two 8-ounce (237 mL) glasses of water every hour for the first 2 hours after you get home. ?Continue to drink water often at home. ?You can eat what  you normally do. ?Keep all follow-up visits as told by your health care provider. This is important. ?If you had a ureteral stent placed, ask your health care provider when you need to return to have it removed. ?Contact a health care provider if you have: ?Chills or a fever. ?Burning pain for longer than 24 hours after the procedure. ?Blood in your urine for longer than 24 hours after the procedure. ?Get help right away if you have: ?Large amounts of blood in your urine. ?Blood clots in your urine. ?Severe pain. ?Chest pain or trouble breathing. ?The feeling of a full bladder and you are unable to urinate. ?These symptoms may represent a serious problem that is an emergency. Do not wait to see if the symptoms will go away. Get medical help right away. Call your local emergency services (911 in the [place] ?Summary ?Ureteroscopy is a procedure to check for and treat problems inside part of the urinary tract. ?In this procedure, a thin, flexible tube with a light at the end (ureteroscope) is used to look at the inside of the kidneys and the ureters. ?You may need this procedure if you have frequent urinary tract infections (UTIs), blood in your urine, or a stone in a ureter. ?This information is not intended to replace advice given to  you by your health care provider. Make sure you discuss any questions you have with your health care provider. ?Document Revised: 01/17/2021 Document Reviewed: 11/24/2018 ?Elsevier Patient Education ? [employer]. ? ?

## 2021-07-10 NOTE — Telephone Encounter (Signed)
I spoke with Jeremy Pacheco while in clinic. We have discussed possible surgery dates and Monday May 15th, 2023 was agreed upon by all parties. Patient given information about surgery date, what to expect pre-operatively and post operatively.  ?We discussed that a Pre-Admission Testing office will be calling to set up the pre-op visit that will take place prior to surgery, and that these appointments are typically done over the phone with a Pre-Admissions RN. ? ? Informed patient that our office will communicate any additional care to be provided after surgery. Patients questions or concerns were discussed during our call. Advised to call our office should there be any additional information, questions or concerns that arise. Patient verbalized understanding.  ? ?

## 2021-07-10 NOTE — Progress Notes (Addendum)
? ?07/10/2021 ?12:44 PM  ? ?Jeremy Pacheco ?[birth year] ?[id number] ? ?Referring provider:  ?Bernerd Limbo, MD ?[address] ?[address] ?[place],  [place]ache[place] [address] ?Chief Complaint  ?Patient presents with  ? Results  ? ? ?HPI: ?Jeremy Pacheco is a 71 y.o.male with a personal history of nephrolithiasis, right hydronephrosis, right renal atrophy, microscopic hematuria, and CKD stage 3, who presents today for a 2 week follow-up with CT results.  ? ?His creatinine has been trending up since 2019. In 2019 his creatinine was 1.38, it increased in 2020 to 1.83 then decreased back down to 1.38. It trended back up to 1.90 in 2021 then to 2.17 in 2022. His most recent creatinine was 2.60 on 04/04/2021.   ?  ?He underwent a RUS on 05/10/2021 to further evaluate CKD. It visualized moderate to marked right hydronephrosis along with cortical thinning, possibly suggesting longstanding obstruction of right ureter. There is 8 mm calcific density in the urinary bladder which may suggest recently passed ureteral calculus or calculus at the right ureterovesical junction. ? ?CT renal stone study on 07/09/2021 visualized a 4 mm calculus in the lower pile of left kidney. Large calculus at the right UPJ measuring 1.8 x 1 x 1.4 cm. Associated sever hydronephrosis with mild renal cortical thinning. A tiny 1-2 mm calculi in the lower pole right kidney. A 1.6 cm hypodense cyst at the lower pole right kidney, and a 6 mm calculus within the posterior right urinary bladder.  ? ?He is currently being treated for positive urine culture, finishing Macrobid.  He is fairly asymptomatic. ? ?Never previously required intervention for stones. ? ? ?PMH: ?Past Medical History:  ?Diagnosis Date  ? Diabetes mellitus without complication ([place])   ? Hypertension   ? ? ?Surgical History: ?No past surgical history on file. ? ?Home Medications:  ?Allergies as of 07/10/2021   ? ?   Reactions  ? Penicillins Hives  ? childhood  ? Lisinopril Cough  ? ?  ? ?  ?Medication  List  ?  ? ?  ? Accurate as of Jul 10, 2021 12:44 PM. If you have any questions, ask your nurse or doctor.  ?  ?  ? ?  ? ?AMLODIPINE BENZOATE PO ?Take by mouth. ?  ?indomethacin 50 MG capsule ?Commonly known as: INDOCIN ?Take 1 capsule (50 mg total) by mouth 2 (two) times daily with a meal. ?  ?METFORMIN HCL PO ?Take by mouth. ?  ?nitrofurantoin (macrocrystal-monohydrate) 100 MG capsule ?Commonly known as: Macrobid ?Take 1 capsule (100 mg total) by mouth 2 (two) times daily. ?  ? ?  ? ? ?Allergies:  ?Allergies  ?Allergen Reactions  ? Penicillins Hives  ?  childhood  ? Lisinopril Cough  ? ? ?Family History: ?Family History  ?Problem Relation Age of Onset  ? Heart disease Mother   ? Diabetes Mother   ? Heart disease Father   ? Diabetes Father   ? ? ?Social History:  reports that he has never smoked. He has never used smokeless tobacco. He reports that he does not currently use alcohol. He reports that he does not use drugs. ? ? ?Physical Exam: ?BP (!) 181/99   Pulse 100   Ht 5\' 10"  (1.778 m)   Wt 204 lb (92.5 kg)   BMI 29.27 kg/m?   ?Constitutional:  Alert and oriented, No acute distress.  Companied by wife today. ?HEENT: [place] AT, moist mucus membranes.  Trachea midline, no masses. ?Cardiovascular: No clubbing, cyanosis, or edema. ?Respiratory: Normal  respiratory effort, no increased work of breathing. ?Skin: No rashes, bruises or suspicious lesions. ?Neurologic: Grossly intact, no focal deficits, moving all 4 extremities. ?Psychiatric: Normal mood and affect. ? ?Laboratory Data: ? ?Lab Results  ?Component Value Date  ? CREATININE 1.28 05/27/2012  ? ? ?Pertinent Imaging: ?CLINICAL DATA:  Flank pain ?  ?EXAM: ?CT ABDOMEN AND PELVIS WITHOUT CONTRAST ?  ?TECHNIQUE: ?Multidetector CT imaging of the abdomen and pelvis was performed ?following the standard protocol without IV contrast. ?  ?RADIATION DOSE REDUCTION: This exam was performed according to the ?departmental dose-optimization program which includes  automated ?exposure control, adjustment of the mA and/or kV according to ?patient size and/or use of iterative reconstruction technique. ?  ?COMPARISON:  Renal ultrasound 05/10/2021 ?  ?FINDINGS: ?Lower chest: No acute abnormality. ?  ?Hepatobiliary: Liver is normal in size and contour with no ?suspicious mass identified. Gallbladder appears grossly normal. No ?biliary ductal dilatation identified. ?  ?Pancreas: 2 subcentimeter hypodensities are visualized in the ?pancreatic head measuring up to 5 mm, indeterminate. No inflammatory ?changes of the pancreas or pancreatic ductal dilatation. ?  ?Spleen: Normal in size without focal abnormality. ?  ?Adrenals/Urinary Tract: Adrenal glands are normal. 4 mm calculus in ?the lower pole left kidney. Large calculus at the right UPJ ?measuring 1.8 x 1 x 1.4 cm. Associated severe right hydronephrosis ?with mild renal cortical thinning. Tiny 1-2 mm calculi in the lower ?pole right kidney. 1.9 cm hypodense likely cyst at the lower pole ?right kidney. 1.6 cm hypodense likely cyst at the upper pole right ?kidney. No hydroureter identified bilaterally. 6 mm calculus within ?the posterior right urinary bladder. No bladder wall mass ?visualized. ?  ?Stomach/Bowel: No bowel obstruction, free air or pneumatosis. ?Colonic diverticulosis. No bowel wall edema identified. Moderate to ?large amount of retained fecal material throughout the colon. ?Appendix is normal. ?  ?Vascular/Lymphatic: Aortic atherosclerosis. No enlarged abdominal or ?pelvic lymph nodes. ?  ?Reproductive: Prostate gland appears mildly enlarged. ?  ?Other: No ascites. ?  ?Musculoskeletal: No suspicious bony lesions identified. ?  ?IMPRESSION: ?1. Large obstructing calculus at the right UPJ measuring up to 1.8 ?cm in size. Severe right hydronephrosis with mild cortical thinning. ?2. 6 mm calculus in the posterior right urinary bladder. ?3. Small bilateral nephrolithiasis. ?4. Indeterminate subcentimeter hypodensities in  the pancreatic head, ?possibly cystic lesions, consider follow-up MRI abdomen with ?contrast. ?5. Right renal cysts. ?6. Colonic diverticulosis. ?  ?  ?Electronically Signed ?  By: Ofilia Neas M.D. ?  On: 07/10/2021 04:41 ? ? ?I have personally reviewed the images and agree with radiologist interpretation.  ? ? ?Assessment & Plan:   ? ?Right ureteral stoneNephrolithiasis / right hydronephrosis / right renal atrophy ?- Reviewed imaging and discussed renal function, stone is likely very chronic.  Given his overall poor renal function, recommended treating the stone pain and trying to preserve as much renal function as possible by relief of obstruction.   ? ?-Stone is large, 1.8 cm, 700 Hounsfield units. ? ?- We discussed various treatment options for urolithiasis including observation with or without medical expulsive therapy, shockwave lithotripsy (SWL), ureteroscopy and laser lithotripsy with stent placement, and percutaneous nephrolithotomy. ?  ?We discussed that management is based on stone size, location, density, patient co-morbidities, and patient preference.  ?  ?Stones <70mm in size have a >80% spontaneous passage rate. Data surrounding the use of tamsulosin for medical expulsive therapy is controversial, but meta analyses suggests it is most efficacious for distal stones between 5-64mm in size.  Possible side effects include dizziness/lightheadedness, and retrograde ejaculation. ?  ?SWL has a lower stone free rate in a single procedure, but also a lower complication rate compared to ureteroscopy and avoids a stent and associated stent related symptoms. Possible complications include renal hematoma, steinstrasse, and need for additional treatment. We discussed the role of his increased skin to stone distance can lead to decreased efficacy with shockwave lithotripsy. ?  ?Ureteroscopy with laser lithotripsy and stent placement has a higher stone free rate than SWL in a single procedure, however increased  complication rate including possible infection, ureteral injury, bleeding, and stent related morbidity. Common stent related symptoms include dysuria, urgency/frequency, and flank pain. ?  ?After an extensive discussion

## 2021-07-10 NOTE — Progress Notes (Signed)
[facility] Posting Form  ? ?Surgery Date/Time: Date: 07/15/2021 ? ?Surgeon: Dr. Hollice Espy, MD ? ?Surgery Location: Day Surgery ? ?Inpt ( No  )   Outpt (Yes)   Obs ( No  )  ? ?Diagnosis: N20.0 Right Nephrolithiasis, Bladder Stone N21.0 ? ?-CPT: 12904, 75339 ? ?Surgery: Right Ureteroscopy with laser lithotripsy and stent placement, Cystolitholapaxy ? ?Stop Anticoagulations: N/A, may continue ASA ? ?Cardiac/Medical/Pulmonary Clearance needed: no ? ?*Orders entered into EPIC  Date: 07/10/21  ? ?*Case booked in [place]  Date: 07/10/21 ? ?*Notified pt of Surgery: Date: 07/10/21 ? ?PRE-OP UA & CX: no ? ?*Placed into Prior Authorization Work Que Date: 07/10/21 ? ? ?Assistant/laser/rep:No ? ? ? ? ? ? ? ? ? ? ? ? ? ? ? ?

## 2021-07-10 NOTE — H&P (View-Only) (Signed)
? ?07/10/2021 ?12:44 PM  ? ?Doug Sou ?09-24-1950 ?[id number] ? ?Referring provider:  ?Bernerd Limbo, MD ?[address] ?[address] ?[place],  [place] [address] ?Chief Complaint  ?Patient presents with  ? Results  ? ? ?HPI: ?Jeremy Pacheco is a 71 y.o.male with a personal history of nephrolithiasis, right hydronephrosis, right renal atrophy, microscopic hematuria, and CKD stage 3, who presents today for a 2 week follow-up with CT results.  ? ?His creatinine has been trending up since 2019. In 2019 his creatinine was 1.38, it increased in 2020 to 1.83 then decreased back down to 1.38. It trended back up to 1.90 in 2021 then to 2.17 in 2022. His most recent creatinine was 2.60 on 04/04/2021.   ?  ?He underwent a RUS on 05/10/2021 to further evaluate CKD. It visualized moderate to marked right hydronephrosis along with cortical thinning, possibly suggesting longstanding obstruction of right ureter. There is 8 mm calcific density in the urinary bladder which may suggest recently passed ureteral calculus or calculus at the right ureterovesical junction. ? ?CT renal stone study on 07/09/2021 visualized a 4 mm calculus in the lower pile of left kidney. Large calculus at the right UPJ measuring 1.8 x 1 x 1.4 cm. Associated sever hydronephrosis with mild renal cortical thinning. A tiny 1-2 mm calculi in the lower pole right kidney. A 1.6 cm hypodense cyst at the lower pole right kidney, and a 6 mm calculus within the posterior right urinary bladder.  ? ?He is currently being treated for positive urine culture, finishing Macrobid.  He is fairly asymptomatic. ? ?Never previously required intervention for stones. ? ? ?PMH: ?Past Medical History:  ?Diagnosis Date  ? Diabetes mellitus without complication ([place])   ? Hypertension   ? ? ?Surgical History: ?No past surgical history on file. ? ?Home Medications:  ?Allergies as of 07/10/2021   ? ?   Reactions  ? Penicillins Hives  ? childhood  ? Lisinopril Cough  ? ?  ? ?  ?Medication  List  ?  ? ?  ? Accurate as of Jul 10, 2021 12:44 PM. If you have any questions, ask your nurse or doctor.  ?  ?  ? ?  ? ?AMLODIPINE BENZOATE PO ?Take by mouth. ?  ?indomethacin 50 MG capsule ?Commonly known as: INDOCIN ?Take 1 capsule (50 mg total) by mouth 2 (two) times daily with a meal. ?  ?METFORMIN HCL PO ?Take by mouth. ?  ?nitrofurantoin (macrocrystal-monohydrate) 100 MG capsule ?Commonly known as: Macrobid ?Take 1 capsule (100 mg total) by mouth 2 (two) times daily. ?  ? ?  ? ? ?Allergies:  ?Allergies  ?Allergen Reactions  ? Penicillins Hives  ?  childhood  ? Lisinopril Cough  ? ? ?Family History: ?Family History  ?Problem Relation Age of Onset  ? Heart disease Mother   ? Diabetes Mother   ? Heart disease Father   ? Diabetes Father   ? ? ?Social History:  reports that he has never smoked. He has never used smokeless tobacco. He reports that he does not currently use alcohol. He reports that he does not use drugs. ? ? ?Physical Exam: ?BP (!) 181/99   Pulse 100   Ht 5\' 10"  (1.778 m)   Wt 204 lb (92.5 kg)   BMI 29.27 kg/m?   ?Constitutional:  Alert and oriented, No acute distress.  Companied by wife today. ?HEENT: [place] AT, moist mucus membranes.  Trachea midline, no masses. ?Cardiovascular: No clubbing, cyanosis, or edema. ?Respiratory: Normal  respiratory effort, no increased work of breathing. ?Skin: No rashes, bruises or suspicious lesions. ?Neurologic: Grossly intact, no focal deficits, moving all 4 extremities. ?Psychiatric: Normal mood and affect. ? ?Laboratory Data: ? ?Lab Results  ?Component Value Date  ? CREATININE 1.28 05/27/2012  ? ? ?Pertinent Imaging: ?CLINICAL DATA:  Flank pain ?  ?EXAM: ?CT ABDOMEN AND PELVIS WITHOUT CONTRAST ?  ?TECHNIQUE: ?Multidetector CT imaging of the abdomen and pelvis was performed ?following the standard protocol without IV contrast. ?  ?RADIATION DOSE REDUCTION: This exam was performed according to the ?departmental dose-optimization program which includes  automated ?exposure control, adjustment of the mA and/or kV according to ?patient size and/or use of iterative reconstruction technique. ?  ?COMPARISON:  Renal ultrasound 05/10/2021 ?  ?FINDINGS: ?Lower chest: No acute abnormality. ?  ?Hepatobiliary: Liver is normal in size and contour with no ?suspicious mass identified. Gallbladder appears grossly normal. No ?biliary ductal dilatation identified. ?  ?Pancreas: 2 subcentimeter hypodensities are visualized in the ?pancreatic head measuring up to 5 mm, indeterminate. No inflammatory ?changes of the pancreas or pancreatic ductal dilatation. ?  ?Spleen: Normal in size without focal abnormality. ?  ?Adrenals/Urinary Tract: Adrenal glands are normal. 4 mm calculus in ?the lower pole left kidney. Large calculus at the right UPJ ?measuring 1.8 x 1 x 1.4 cm. Associated severe right hydronephrosis ?with mild renal cortical thinning. Tiny 1-2 mm calculi in the lower ?pole right kidney. 1.9 cm hypodense likely cyst at the lower pole ?right kidney. 1.6 cm hypodense likely cyst at the upper pole right ?kidney. No hydroureter identified bilaterally. 6 mm calculus within ?the posterior right urinary bladder. No bladder wall mass ?visualized. ?  ?Stomach/Bowel: No bowel obstruction, free air or pneumatosis. ?Colonic diverticulosis. No bowel wall edema identified. Moderate to ?large amount of retained fecal material throughout the colon. ?Appendix is normal. ?  ?Vascular/Lymphatic: Aortic atherosclerosis. No enlarged abdominal or ?pelvic lymph nodes. ?  ?Reproductive: Prostate gland appears mildly enlarged. ?  ?Other: No ascites. ?  ?Musculoskeletal: No suspicious bony lesions identified. ?  ?IMPRESSION: ?1. Large obstructing calculus at the right UPJ measuring up to 1.8 ?cm in size. Severe right hydronephrosis with mild cortical thinning. ?2. 6 mm calculus in the posterior right urinary bladder. ?3. Small bilateral nephrolithiasis. ?4. Indeterminate subcentimeter hypodensities in  the pancreatic head, ?possibly cystic lesions, consider follow-up MRI abdomen with ?contrast. ?5. Right renal cysts. ?6. Colonic diverticulosis. ?  ?  ?Electronically Signed ?  By: Ofilia Neas M.D. ?  On: 07/10/2021 04:41 ? ? ?I have personally reviewed the images and agree with radiologist interpretation.  ? ? ?Assessment & Plan:   ? ?Right ureteral stoneNephrolithiasis / right hydronephrosis / right renal atrophy ?- Reviewed imaging and discussed renal function, stone is likely very chronic.  Given his overall poor renal function, recommended treating the stone pain and trying to preserve as much renal function as possible by relief of obstruction.   ? ?-Stone is large, 1.8 cm, 700 Hounsfield units. ? ?- We discussed various treatment options for urolithiasis including observation with or without medical expulsive therapy, shockwave lithotripsy (SWL), ureteroscopy and laser lithotripsy with stent placement, and percutaneous nephrolithotomy. ?  ?We discussed that management is based on stone size, location, density, patient co-morbidities, and patient preference.  ?  ?Stones <44mm in size have a >80% spontaneous passage rate. Data surrounding the use of tamsulosin for medical expulsive therapy is controversial, but meta analyses suggests it is most efficacious for distal stones between 5-61mm in size.  Possible side effects include dizziness/lightheadedness, and retrograde ejaculation. ?  ?SWL has a lower stone free rate in a single procedure, but also a lower complication rate compared to ureteroscopy and avoids a stent and associated stent related symptoms. Possible complications include renal hematoma, steinstrasse, and need for additional treatment. We discussed the role of his increased skin to stone distance can lead to decreased efficacy with shockwave lithotripsy. ?  ?Ureteroscopy with laser lithotripsy and stent placement has a higher stone free rate than SWL in a single procedure, however increased  complication rate including possible infection, ureteral injury, bleeding, and stent related morbidity. Common stent related symptoms include dysuria, urgency/frequency, and flank pain. ?  ?After an extensive discussion

## 2021-07-10 NOTE — Progress Notes (Signed)
Surgical Physician Order Form [facility] ? ?* Scheduling expectation : ASAP 07/15/21 ? ?*Length of Case: 2 hours ? ?*Clearance needed: no ? ?*Anticoagulation Instructions: N/A ? ?*Aspirin Instructions: Ok to continue all ? ?*Post-op visit Date/Instructions:   TBD ? ?*Diagnosis: Right Nephrolithiasis; bladder stone ? ?*Procedure: right Ureteroscopy w/laser lithotripsy & stent placement (09233), cystolitholapaxy ? ? ?Additional orders: N/A ? ?-Admit type: OUTpatient ? ?-Anesthesia: General ? ?-VTE Prophylaxis Standing Order SCD?s    ?   ?Other:  ? ?-Standing Lab Orders Per Anesthesia   ? ?Lab other: None ? ?-Standing Test orders EKG/Chest x-ray per Anesthesia      ? ?Test other:  ? ?- Medications:  Ancef 2gm IV ? ?-Other orders:  Ok to proceed with Ancef PCN allergy reviewed ? ? ? ?  ? ?

## 2021-07-12 ENCOUNTER — Other Ambulatory Visit: Payer: Self-pay

## 2021-07-12 ENCOUNTER — Encounter
Admission: RE | Admit: 2021-07-12 | Discharge: 2021-07-12 | Disposition: A | Discharge status: Home / Self Care | Payer: Medicare Other | Source: Ambulatory Visit | Attending: Urology | Admitting: Urology

## 2021-07-12 VITALS — Ht 70.0 in | Wt 205.0 lb

## 2021-07-12 DIAGNOSIS — Z01818 Encounter for other preprocedural examination: Secondary | ICD-10-CM

## 2021-07-12 NOTE — Patient Instructions (Addendum)
Your procedure is scheduled on: 07/15/2021  ?Report to the Registration Desk on the 1st floor of the [facility]. ?To find out your arrival time, please call [phone] between 1PM - 3PM on: 07/12/2021  ?If your arrival time is 6:00 am, do not arrive prior to that time as the [facility] entrance doors do not open until 6:00 am. ? ?REMEMBER: ?Instructions that are not followed completely may result in serious medical risk, up to and including death; or upon the discretion of your surgeon and anesthesiologist your surgery may need to be rescheduled. ? ?Do not eat food after midnight the night before surgery.  ?No gum chewing, lozengers or hard candies. ? ?Type 1 and Type 2 diabetics should only drink water. ? ? ?TAKE THESE MEDICATIONS THE MORNING OF SURGERY WITH A SIP OF WATER: ?Alprazolam ?Amlodipine ?Atorvastatin ?Coreg ? ? ?**Follow new guidelines for insulin and diabetes medications.** ?Do not take metformin 5/13,5/14, 5/15  ?Do not take glipizide on am of surgery.  ? ?Follow recommendations from Cardiologist, Pulmonologist or PCP regarding stopping Aspirin. ? ?One week prior to surgery: ?Stop Anti-inflammatories (NSAIDS) such as Advil, Aleve, Ibuprofen, Motrin, Naproxen, Naprosyn and Aspirin based products such as Excedrin, Goodys Powder, BC Powder. ?Stop ANY OVER THE COUNTER supplements until after surgery like Omega,  ?You may however, continue to take Tylenol if needed for pain up until the day of surgery. ? ?No Alcohol for 24 hours before or after surgery. ? ?No Smoking including e-cigarettes for 24 hours prior to surgery.  ?No chewable tobacco products for at least 6 hours prior to surgery.  ?No nicotine patches on the day of surgery. ? ?Do not use any "recreational" drugs for at least a week prior to your surgery.  ?Please be advised that the combination of cocaine and anesthesia may have negative outcomes, up to and including death. ?If you test positive for cocaine, your surgery will be  cancelled. ? ?On the morning of surgery brush your teeth with toothpaste and water, you may rinse your mouth with mouthwash if you wish. ?Do not swallow any toothpaste or mouthwash. ? ?Use CHG Soap or wipes as directed on instruction sheet.- provided for you ? ?Do not wear jewelry, make-up, hairpins, clips or nail polish. ? ?Do not wear lotions, powders, or perfumes.  ? ?Do not shave body from the neck down 48 hours prior to surgery just in case you cut yourself which could leave a site for infection.  ?Also, freshly shaved skin may become irritated if using the CHG soap. ? ?Contact lenses, hearing aids and dentures may not be worn into surgery. ? ?Do not bring valuables to the hospital. [facility] is not responsible for any missing/lost belongings or valuables.  ? ? ?Notify your doctor if there is any change in your medical condition (cold, fever, infection). ? ?Wear comfortable clothing (specific to your surgery type) to the hospital. ? ?After surgery, you can help prevent lung complications by doing breathing exercises.  ?Take deep breaths and cough every 1-2 hours. Your doctor may order a device called an Incentive Spirometer to help you take deep breaths. ? ?If you are being admitted to the hospital overnight, leave your suitcase in the car. ?After surgery it may be brought to your room. ? ?If you are being discharged the day of surgery, you will not be allowed to drive home. ?You will need a responsible adult (18 years or older) to drive you home and stay with you that night.  ? ?  If you are taking public transportation, you will need to have a responsible adult (18 years or older) with you. ?Please confirm with your physician that it is acceptable to use public transportation.  ? ?Please call the [facility]. at [phone] if you have any questions about these instructions. ? ?Surgery Visitation Policy: ? ?Patients undergoing a surgery or procedure may have two family members or support  persons with them as long as the person is not COVID-19 positive or experiencing its symptoms.  ? ?Inpatient Visitation:   ? ?Visiting hours are 7 a.m. to 8 p.m. ?Up to four visitors are allowed at one time in a patient room, including children. The visitors may rotate out with other people during the day. One designated support person (adult) may remain overnight.  ?

## 2021-07-15 ENCOUNTER — Ambulatory Visit: Payer: Medicare Other | Admitting: Anesthesiology

## 2021-07-15 ENCOUNTER — Ambulatory Visit: Payer: Medicare Other

## 2021-07-15 ENCOUNTER — Ambulatory Visit
Admission: RE | Admit: 2021-07-15 | Discharge: 2021-07-15 | Disposition: A | Discharge status: Home / Self Care | Payer: Medicare Other | Attending: Urology | Admitting: Urology

## 2021-07-15 ENCOUNTER — Encounter: Payer: Self-pay | Admitting: Urology

## 2021-07-15 ENCOUNTER — Other Ambulatory Visit: Payer: Self-pay | Admitting: Urology

## 2021-07-15 ENCOUNTER — Other Ambulatory Visit: Payer: Self-pay

## 2021-07-15 ENCOUNTER — Encounter
Admission: RE | Disposition: A | Discharge status: Home / Self Care | Payer: Self-pay | Source: Home / Self Care | Attending: Urology

## 2021-07-15 DIAGNOSIS — N210 Calculus in bladder: Secondary | ICD-10-CM | POA: Diagnosis not present

## 2021-07-15 DIAGNOSIS — N132 Hydronephrosis with renal and ureteral calculous obstruction: Secondary | ICD-10-CM | POA: Diagnosis not present

## 2021-07-15 DIAGNOSIS — N201 Calculus of ureter: Secondary | ICD-10-CM | POA: Diagnosis not present

## 2021-07-15 DIAGNOSIS — I129 Hypertensive chronic kidney disease with stage 1 through stage 4 chronic kidney disease, or unspecified chronic kidney disease: Secondary | ICD-10-CM | POA: Insufficient documentation

## 2021-07-15 DIAGNOSIS — N200 Calculus of kidney: Secondary | ICD-10-CM

## 2021-07-15 DIAGNOSIS — Z01818 Encounter for other preprocedural examination: Secondary | ICD-10-CM

## 2021-07-15 DIAGNOSIS — N183 Chronic kidney disease, stage 3 unspecified: Secondary | ICD-10-CM | POA: Insufficient documentation

## 2021-07-15 DIAGNOSIS — N1330 Unspecified hydronephrosis: Secondary | ICD-10-CM | POA: Diagnosis not present

## 2021-07-15 DIAGNOSIS — E1122 Type 2 diabetes mellitus with diabetic chronic kidney disease: Secondary | ICD-10-CM | POA: Insufficient documentation

## 2021-07-15 HISTORY — PX: CYSTOSCOPY/URETEROSCOPY/HOLMIUM LASER/STENT PLACEMENT: SHX6546

## 2021-07-15 HISTORY — PX: CYSTOSCOPY WITH LITHOLAPAXY: SHX1425

## 2021-07-15 LAB — CBC
HCT: 36.9 % — ABNORMAL LOW (ref 39.0–52.0)
Hemoglobin: 11.5 g/dL — ABNORMAL LOW (ref 13.0–17.0)
MCH: 22.1 pg — ABNORMAL LOW (ref 26.0–34.0)
MCHC: 31.2 g/dL (ref 30.0–36.0)
MCV: 70.8 fL — ABNORMAL LOW (ref 80.0–100.0)
Platelets: 230 10*3/uL (ref 150–400)
RBC: 5.21 MIL/uL (ref 4.22–5.81)
RDW: 15.1 % (ref 11.5–15.5)
WBC: 3.9 10*3/uL — ABNORMAL LOW (ref 4.0–10.5)
nRBC: 0 % (ref 0.0–0.2)

## 2021-07-15 LAB — GLUCOSE, CAPILLARY
Glucose-Capillary: 171 mg/dL — ABNORMAL HIGH (ref 70–99)
Glucose-Capillary: 129 mg/dL — ABNORMAL HIGH (ref 70–99)

## 2021-07-15 LAB — COMPREHENSIVE METABOLIC PANEL
ALT: 37 U/L (ref 0–44)
AST: 28 U/L (ref 15–41)
Albumin: 3.9 g/dL (ref 3.5–5.0)
Alkaline Phosphatase: 53 U/L (ref 38–126)
Anion gap: 7 (ref 5–15)
BUN: 29 mg/dL — ABNORMAL HIGH (ref 8–23)
CO2: 24 mmol/L (ref 22–32)
Calcium: 10 mg/dL (ref 8.9–10.3)
Chloride: 107 mmol/L (ref 98–111)
Creatinine, Ser: 2.91 mg/dL — ABNORMAL HIGH (ref 0.61–1.24)
GFR, Estimated: 23 mL/min — ABNORMAL LOW (ref >60)
Glucose, Bld: 158 mg/dL — ABNORMAL HIGH (ref 70–99)
Potassium: 4.3 mmol/L (ref 3.5–5.1)
Sodium: 138 mmol/L (ref 135–145)
Total Bilirubin: 0.4 mg/dL (ref 0.3–1.2)
Total Protein: 8 g/dL (ref 6.5–8.1)

## 2021-07-15 SURGERY — CYSTOSCOPY/URETEROSCOPY/HOLMIUM LASER/STENT PLACEMENT
Anesthesia: General | Laterality: Right

## 2021-07-15 MED ORDER — OXYCODONE HCL 5 MG/5ML PO SOLN: 5.0000 mg | Freq: Once | ORAL | Status: DC | PRN

## 2021-07-15 MED ORDER — CHLORHEXIDINE GLUCONATE 0.12 % MT SOLN: 15.0000 mL | Freq: Once | OROMUCOSAL | Status: AC

## 2021-07-15 MED ORDER — LACTATED RINGERS IV SOLN: INTRAVENOUS | Status: DC

## 2021-07-15 MED ORDER — CEFAZOLIN SODIUM-DEXTROSE 2-4 GM/100ML-% IV SOLN
2.0000 g | INTRAVENOUS | Status: AC
  Administered 2021-07-15: 2 g via INTRAVENOUS

## 2021-07-15 MED ORDER — PROPOFOL 10 MG/ML IV BOLUS
INTRAVENOUS | Status: DC | PRN
  Administered 2021-07-15: 50 mg via INTRAVENOUS

## 2021-07-15 MED ORDER — SODIUM CHLORIDE 0.9 % IR SOLN
Status: DC | PRN
  Administered 2021-07-15: 500 mL

## 2021-07-15 MED ORDER — FENTANYL CITRATE (PF) 100 MCG/2ML IJ SOLN
INTRAMUSCULAR | Status: AC
  Filled 2021-07-15: qty 2

## 2021-07-15 MED ORDER — SUGAMMADEX SODIUM 200 MG/2ML IV SOLN
INTRAVENOUS | Status: DC | PRN
  Administered 2021-07-15: 372 mg via INTRAVENOUS

## 2021-07-15 MED ORDER — CHLORHEXIDINE GLUCONATE 0.12 % MT SOLN
OROMUCOSAL | Status: AC
  Administered 2021-07-15: 15 mL via OROMUCOSAL
  Filled 2021-07-15: qty 15

## 2021-07-15 MED ORDER — FAMOTIDINE 20 MG PO TABS
ORAL_TABLET | ORAL | Status: AC
  Administered 2021-07-15: 20 mg via ORAL
  Filled 2021-07-15: qty 1

## 2021-07-15 MED ORDER — SEVOFLURANE IN SOLN
RESPIRATORY_TRACT | Status: AC
  Filled 2021-07-15: qty 250

## 2021-07-15 MED ORDER — OXYCODONE HCL 5 MG PO TABS: 5.0000 mg | ORAL_TABLET | Freq: Once | ORAL | Status: DC | PRN

## 2021-07-15 MED ORDER — OXYBUTYNIN CHLORIDE 5 MG PO TABS: 5.0000 mg | ORAL_TABLET | Freq: Three times a day (TID) | ORAL | 0 refills | Status: DC | PRN

## 2021-07-15 MED ORDER — IOHEXOL 180 MG/ML  SOLN
INTRAMUSCULAR | Status: DC | PRN
  Administered 2021-07-15: 20 mL

## 2021-07-15 MED ORDER — FAMOTIDINE 20 MG PO TABS: 20.0000 mg | ORAL_TABLET | Freq: Once | ORAL | Status: AC

## 2021-07-15 MED ORDER — ROCURONIUM BROMIDE 100 MG/10ML IV SOLN
INTRAVENOUS | Status: DC | PRN
  Administered 2021-07-15: 50 mg via INTRAVENOUS
  Administered 2021-07-15: 20 mg via INTRAVENOUS

## 2021-07-15 MED ORDER — ONDANSETRON HCL 4 MG/2ML IJ SOLN
INTRAMUSCULAR | Status: DC | PRN
  Administered 2021-07-15: 4 mg via INTRAVENOUS

## 2021-07-15 MED ORDER — GLYCOPYRROLATE 0.2 MG/ML IJ SOLN
INTRAMUSCULAR | Status: DC | PRN
  Administered 2021-07-15: .2 mg via INTRAVENOUS

## 2021-07-15 MED ORDER — SODIUM CHLORIDE FLUSH 0.9 % IV SOLN
INTRAVENOUS | Status: AC
  Filled 2021-07-15: qty 10

## 2021-07-15 MED ORDER — FENTANYL CITRATE (PF) 100 MCG/2ML IJ SOLN
INTRAMUSCULAR | Status: DC | PRN
  Administered 2021-07-15: 25 ug via INTRAVENOUS
  Administered 2021-07-15: 50 ug via INTRAVENOUS

## 2021-07-15 MED ORDER — ACETAMINOPHEN 10 MG/ML IV SOLN: 1000.0000 mg | Freq: Once | INTRAVENOUS | Status: DC | PRN

## 2021-07-15 MED ORDER — OXYCODONE-ACETAMINOPHEN 5-325 MG PO TABS: 1.0000 | ORAL_TABLET | ORAL | 0 refills | Status: AC | PRN

## 2021-07-15 MED ORDER — SODIUM CHLORIDE 0.9 % IV SOLN: INTRAVENOUS | Status: DC

## 2021-07-15 MED ORDER — LIDOCAINE HCL (CARDIAC) PF 100 MG/5ML IV SOSY
PREFILLED_SYRINGE | INTRAVENOUS | Status: DC | PRN
Start: 2021-07-15 — End: 2021-07-15
  Administered 2021-07-15: 100 mg via INTRAVENOUS

## 2021-07-15 MED ORDER — CEFAZOLIN SODIUM-DEXTROSE 2-4 GM/100ML-% IV SOLN
INTRAVENOUS | Status: AC
  Filled 2021-07-15: qty 100

## 2021-07-15 MED ORDER — FENTANYL CITRATE (PF) 100 MCG/2ML IJ SOLN: 25.0000 ug | INTRAMUSCULAR | Status: DC | PRN

## 2021-07-15 MED ORDER — ORAL CARE MOUTH RINSE: 15.0000 mL | Freq: Once | OROMUCOSAL | Status: AC

## 2021-07-15 MED ORDER — ONDANSETRON HCL 4 MG/2ML IJ SOLN: 4.0000 mg | Freq: Once | INTRAMUSCULAR | Status: DC | PRN

## 2021-07-15 MED ORDER — TAMSULOSIN HCL 0.4 MG PO CAPS: 0.4000 mg | ORAL_CAPSULE | Freq: Every day | ORAL | 0 refills | Status: DC

## 2021-07-15 MED ORDER — EPHEDRINE SULFATE (PRESSORS) 50 MG/ML IJ SOLN
INTRAMUSCULAR | Status: DC | PRN
  Administered 2021-07-15: 10 mg via INTRAVENOUS

## 2021-07-15 SURGICAL SUPPLY — 39 items
ADH LQ OCL WTPRF AMP STRL LF (MISCELLANEOUS)
ADHESIVE MASTISOL STRL (MISCELLANEOUS) IMPLANT
BAG DRAIN CYSTO-URO LG1000N (MISCELLANEOUS) ×3 IMPLANT
BASKET ZERO TIP 1.9FR (BASKET) ×2 IMPLANT
BRUSH SCRUB EZ 1% IODOPHOR (MISCELLANEOUS) ×3 IMPLANT
BSKT STON RTRVL ZERO TP 1.9FR (BASKET)
CATH URET FLEX-TIP 2 LUMEN 10F (CATHETERS) ×1 IMPLANT
CATH URETL 5X70 OPEN END (CATHETERS) ×1 IMPLANT
CATH URETL OPEN 5X70 (CATHETERS) ×3 IMPLANT
CNTNR SPEC 2.5X3XGRAD LEK (MISCELLANEOUS)
CONT SPEC 4OZ STER OR WHT (MISCELLANEOUS)
CONT SPEC 4OZ STRL OR WHT (MISCELLANEOUS)
CONTAINER SPEC 2.5X3XGRAD LEK (MISCELLANEOUS) IMPLANT
DRAPE UTILITY 15X26 TOWEL STRL (DRAPES) ×3 IMPLANT
DRSG TEGADERM 2-3/8X2-3/4 SM (GAUZE/BANDAGES/DRESSINGS) IMPLANT
GAUZE 4X4 16PLY ~~LOC~~+RFID DBL (SPONGE) ×5 IMPLANT
GLOVE BIO SURGEON STRL SZ 6.5 (GLOVE) ×3 IMPLANT
GOWN STRL REUS W/ TWL LRG LVL3 (GOWN DISPOSABLE) ×4 IMPLANT
GOWN STRL REUS W/TWL LRG LVL3 (GOWN DISPOSABLE) ×9
GUIDEWIRE GREEN .038 145CM (MISCELLANEOUS) ×1 IMPLANT
GUIDEWIRE STR DUAL SENSOR (WIRE) ×4 IMPLANT
IV NS IRRIG 3000ML ARTHROMATIC (IV SOLUTION) ×5 IMPLANT
KIT TURNOVER CYSTO (KITS) ×3 IMPLANT
MANIFOLD NEPTUNE II (INSTRUMENTS) ×2 IMPLANT
PACK CYSTO AR (MISCELLANEOUS) ×3 IMPLANT
SET CYSTO W/LG BORE CLAMP LF (SET/KITS/TRAYS/PACK) ×3 IMPLANT
SET IRRIG Y TYPE TUR BLADDER L (SET/KITS/TRAYS/PACK) ×3 IMPLANT
SHEATH NAVIGATOR HD 12/14X36 (SHEATH) IMPLANT
SHEATH URETERAL 12FR 45CM (SHEATH) ×1 IMPLANT
STENT URET 6FRX24 CONTOUR (STENTS) IMPLANT
STENT URET 6FRX26 CONTOUR (STENTS) ×1 IMPLANT
SURGILUBE 2OZ TUBE FLIPTOP (MISCELLANEOUS) ×3 IMPLANT
SYR 20ML LL LF (SYRINGE) ×1 IMPLANT
SYR TOOMEY IRRIG 70ML (MISCELLANEOUS) ×3
SYRINGE TOOMEY IRRIG 70ML (MISCELLANEOUS) ×2 IMPLANT
TRACTIP FLEXIVA PULSE ID 200 (Laser) ×2 IMPLANT
WATER STERILE IRR 1000ML POUR (IV SOLUTION) ×3 IMPLANT
WATER STERILE IRR 500ML POUR (IV SOLUTION) ×3 IMPLANT
super stiff straight tip ×1 IMPLANT

## 2021-07-15 NOTE — Transfer of Care (Signed)
Immediate Anesthesia Transfer of Care Note ? ?Patient: Jeremy Pacheco ? ?Procedure(s) Performed: CYSTOSCOPY/URETEROSCOPY/HOLMIUM LASER/STENT PLACEMENT (Right) ?CYSTOSCOPY WITH LITHOLAPAXY ? ?Patient Location: PACU ? ?Anesthesia Type:General ? ?Level of Consciousness: awake and drowsy ? ?Airway & Oxygen Therapy: Patient Spontanous Breathing ? ?Post-op Assessment: Post -op Vital signs reviewed and stable ? ?Post vital signs: stable ? ?Last Vitals:  ?Vitals Value Taken Time  ?BP 155/90 07/15/21 1252  ?Temp    ?Pulse 60 07/15/21 1255  ?Resp 20 07/15/21 1255  ?SpO2 100 % 07/15/21 1255  ?Vitals shown include unvalidated device data. ? ?Last Pain:  ?Vitals:  ? 07/15/21 0912  ?TempSrc: Temporal  ?PainSc: 0-No pain  ?   ? ?  ? ?Complications: No notable events documented. ?

## 2021-07-15 NOTE — Interval H&P Note (Signed)
History and Physical Interval Note: ? ?07/15/2021 ?10:46 AM ? ?Jeremy Pacheco  has presented today for surgery, with the diagnosis of Right Nephrolithiasis ?Bladder Stone.  The various methods of treatment have been discussed with the patient and family. After consideration of risks, benefits and other options for treatment, the patient has consented to  Procedure(s): ?CYSTOSCOPY/URETEROSCOPY/HOLMIUM LASER/STENT PLACEMENT (Right) ?CYSTOSCOPY WITH LITHOLAPAXY (N/A) as a surgical intervention.  The patient's history has been reviewed, patient examined, no change in status, stable for surgery.  I have reviewed the patient's chart and labs.  Questions were answered to the patient's satisfaction.   ? ?RRR ?CTAB ? ?Hollice Espy ? ? ?

## 2021-07-15 NOTE — Anesthesia Preprocedure Evaluation (Addendum)
Anesthesia Evaluation  ?Patient identified by MRN, date of birth, ID band ?Patient awake ? ? ? ?Reviewed: ?Allergy & Precautions, NPO status , Patient's Chart, lab work & pertinent test results ? ?History of Anesthesia Complications ?Negative for: history of anesthetic complications ? ?Airway ?Mallampati: I ? ? ?Neck ROM: Full ? ? ? Dental ? ?(+) Missing ?  ?Pulmonary ?neg pulmonary ROS,  ?  ?Pulmonary exam normal ?breath sounds clear to auscultation ? ? ? ? ? ? Cardiovascular ?hypertension, Normal cardiovascular exam ?Rhythm:Regular Rate:Normal ? ?ECG 07/15/21: Sinus bradycardia (HR 57); 1st degree AVB ?  ?Neuro/Psych ?negative neurological ROS ?   ? GI/Hepatic ?negative GI ROS,   ?Endo/Other  ?diabetes, Type 2 ? Renal/GU ?Renal disease (nephrolithiasis, stage III CKD)  ? ?  ?Musculoskeletal ? ? Abdominal ?  ?Peds ? Hematology ?negative hematology ROS ?(+)   ?Anesthesia Other Findings ? ? Reproductive/Obstetrics ? ?  ? ? ? ? ? ? ? ? ? ? ? ? ? ?  ?  ? ? ? ? ? ? ? ?Anesthesia Physical ?Anesthesia Plan ? ?ASA: 2 ? ?Anesthesia Plan: General  ? ?Post-op Pain Management:   ? ?Induction: Intravenous ? ?PONV Risk Score and Plan: 2 and Ondansetron, Dexamethasone and Treatment may vary due to age or medical condition ? ?Airway Management Planned: Oral ETT ? ?Additional Equipment:  ? ?Intra-op Plan:  ? ?Post-operative Plan: Extubation in OR ? ?Informed Consent: I have reviewed the patients History and Physical, chart, labs and discussed the procedure including the risks, benefits and alternatives for the proposed anesthesia with the patient or authorized representative who has indicated his/her understanding and acceptance.  ? ? ? ?Dental advisory given ? ?Plan Discussed with: CRNA ? ?Anesthesia Plan Comments: (Patient consented for risks of anesthesia including but not limited to:  ?- adverse reactions to medications ?- damage to eyes, teeth, lips or other oral mucosa ?- nerve damage due to  positioning  ?- sore throat or hoarseness ?- damage to heart, brain, nerves, lungs, other parts of body or loss of life ? ?Informed patient about role of CRNA in peri- and intra-operative care.  Patient voiced understanding.)  ? ? ? ? ? ?Anesthesia Quick Evaluation ? ?

## 2021-07-15 NOTE — Brief Op Note (Signed)
Date of procedure: 07/15/21 ? ?Preoperative diagnosis:  ?Right UPJ stone ?Bladder stone ? ?Postoperative diagnosis:  ?Right UPJ stone ?Right UVJ stone ? ?Procedure: ?Right ureteroscopy with laser lithotripsy ?Right retrograde pyelogram ?Right ureteral stent placement ?Interpretation of fluoroscopy less than 30 minutes ? ?Surgeon: Hollice Espy, MD ? ?Anesthesia: General ? ?Complications: None ? ?Intraoperative findings: Large 1.8 cm right proximal ureteral stone relatively impacted, required angled glide to navigate wire around stone.  Stone that was felt to be a bladder actually in the right distal ureter. ? ?EBL: Minimal ? ?Specimens: Stone fragment ? ?Drains: 6 x 26 French double-J ureteral stent on right ? ?Indication: Jeremy Pacheco is a 71 y.o. 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  He also has chronic severe hydronephrosis on the side with significant cortical atrophy.  After reviewing the management options for treatment, he elected to proceed with the above surgical procedure(s). We have discussed the potential benefits and risks of the procedure, side effects of the proposed treatment, the likelihood of the patient achieving the goals of the procedure, and any potential problems that might occur during the procedure or recuperation. Informed consent has been obtained. ? ?Description of procedure: ? ?The patient was taken to the operating room and general anesthesia was induced.  The patient was placed in the dorsal lithotomy position, prepped and draped in the usual sterile fashion, and preoperative antibiotics were administered. A preoperative time-out was performed.  ? ?A 21 [place] scope was advanced per urethra into the bladder.  Attention was turned to the right UO.  On scout imaging, the large 1.8 cm stone was seen very easily.  The bladder stone was not identified.  A sensor wire was then advanced up just distal to the stone.  I  then advanced an open-ended ureteral catheter to the proximal ureter and try to navigate the wire around the stone unsuccessfully.  I ended up using an angled Glidewire which ultimately was successful in getting around the stone.  Advanced a 5 [place] open-ended ureteral catheter around the stone into the renal pelvis and use that to exchange for a Super Stiff wire which was snapped in place as a working wire.  I then used a dual-lumen access sheath to the level of the mid ureter to introduce this to a second sensor wire which curled just proximal to the stone.  A Cook 12/14 French ureteral access sheath was then used advanced to the proximal ureter without difficulty.  The inner cannula and sensor wire were removed leaving the safety wire in place.  Next, a digital flexible ureteroscope, 8 [place] was advanced to the level of the stone.  A 200 ?m Moses fiber was used at settings of 0.3 and 120 J to dust the stone into innumerable tiny fragments.  Some of these fragments refluxed into the kidney into a midpole calyx were was further dusted into very fine like powder, no greater than the tip of the laser fiber.  Finally, retrograde pyelogram showed severe hydroureteronephrosis.  This was stable from a CT scan.  There is no contrast extravasation.  There were no filling defects.  I used this as a roadmap to ensure that each and every calyx was directly visualized and free of all significant residual stone fragment.  I then backed the scope down the length the ureter.  Unfortunately the distal ureter, I saw what appeared to be an embedded distal ureteral stone.  I exchanged the flexible ureteroscope  for a semirigid ureteroscope and was able to use 0.8 J and 10 Hz to fragment the stone which was located within a mucosal flap.  I was able to clear the entirety of this stone.  I was able to navigate easily back-and-forth through the distal ureter once all of the stone fragment had been cleared.  Contrast was further  injected and there was no contrast extravasation.  Finally, I backloaded the safety wire over a rigid cystoscope.  A 6 x 26 double-J ureteral stent was advanced over the wire up to the level of the kidney.  Upon wire withdrawal, a full coil was noted both within the renal pelvis as well as within the bladder.  The bladder was then drained.  The patient was then cleaned and dried, repositioned in the supine position, reversed from anesthesia, taken to the PACU in stable condition. ? ?Plan: We will have him keep his stent for 2 weeks and plan for cystoscopy, stent removal in the office.  We will plan for renal ultrasound 6 weeks after the stent is removed.  We may end up needing to get a Lasix renal study to assess the overall renal function on the right as well as assess for ongoing obstruction given that I anticipate the hydronephrosis may not resolve given its chronicity. ? ?Hollice Espy, M.D. ? ? ?

## 2021-07-15 NOTE — Discharge Instructions (Addendum)
You have a ureteral stent in place.  This is a tube that extends from your kidney to your bladder.  This may cause urinary bleeding, burning with urination, and urinary frequency.  Please call our office or present to the ED if you develop fevers >101 or pain which is not able to be controlled with oral pain medications.  You may be given either Flomax and/ or ditropan to help with bladder spasms and stent pain in addition to pain medications.    [facility] [address], [address] , [place] [address] [phone]  AMBULATORY SURGERY  DISCHARGE INSTRUCTIONS   The drugs that you were given will stay in your system until tomorrow so for the next 24 hours you should not:  Drive an automobile Make any legal decisions Drink any alcoholic beverage   You may resume regular meals tomorrow.  Today it is better to start with liquids and gradually work up to solid foods.  You may eat anything you prefer, but it is better to start with liquids, then soup and crackers, and gradually work up to solid foods.   Please notify your doctor immediately if you have any unusual bleeding, trouble breathing, redness and pain at the surgery site, drainage, fever, or pain not relieved by medication.    Additional Instructions:   Please contact your physician with any problems or Same Day Surgery at [phone], Monday through Friday 6 am to 4 pm, or [facility] number at [phone].  

## 2021-07-15 NOTE — Anesthesia Procedure Notes (Signed)
Procedure Name: Intubation ?Date/Time: 07/15/2021 11:23 AM ?Performed by: Kerri Perches, CRNA ?Pre-anesthesia Checklist: Patient identified, Patient being monitored, Timeout performed, Emergency Drugs available and Suction available ?Patient Re-evaluated:Patient Re-evaluated prior to induction ?Oxygen Delivery Method: Circle system utilized ?Preoxygenation: Pre-oxygenation with 100% oxygen ?Induction Type: IV induction ?Ventilation: Mask ventilation without difficulty ?Laryngoscope Size: 3 and McGraph ?Grade View: Grade I ?Tube type: Oral ?Tube size: 7.0 mm ?Number of attempts: 1 ?Airway Equipment and Method: Stylet ?Placement Confirmation: ETT inserted through vocal cords under direct vision, positive ETCO2 and breath sounds checked- equal and bilateral ?Secured at: 23 cm ?Tube secured with: Tape ?Dental Injury: Teeth and Oropharynx as per pre-operative assessment  ? ? ? ? ?

## 2021-07-15 NOTE — Anesthesia Postprocedure Evaluation (Signed)
Anesthesia Post Note ? ?Patient: Jeremy Pacheco ? ?Procedure(s) Performed: CYSTOSCOPY/URETEROSCOPY/HOLMIUM LASER/STENT PLACEMENT (Right) ?CYSTOSCOPY WITH LITHOLAPAXY ? ?Patient location during evaluation: PACU ?Anesthesia Type: General ?Level of consciousness: awake and alert, oriented and patient cooperative ?Pain management: pain level controlled ?Vital Signs Assessment: post-procedure vital signs reviewed and stable ?Respiratory status: spontaneous breathing, nonlabored ventilation and respiratory function stable ?Cardiovascular status: blood pressure returned to baseline and stable ?Postop Assessment: adequate PO intake ?Anesthetic complications: no ? ? ?No notable events documented. ? ? ?Last Vitals:  ?Vitals:  ? 07/15/21 1331 07/15/21 1343  ?BP: (!) 145/97 (!) 176/97  ?Pulse: 60 62  ?Resp: 12 14  ?Temp: (!) 36.2 ?C (!) 36.2 ?C  ?SpO2: 99% 97%  ?  ?Last Pain:  ?Vitals:  ? 07/15/21 1343  ?TempSrc: Temporal  ?PainSc: 0-No pain  ? ? ?  ?  ?  ?  ?  ?  ? ?Darrin Nipper ? ? ? ? ?

## 2021-07-16 ENCOUNTER — Encounter: Payer: Self-pay | Admitting: Urology

## 2021-07-16 NOTE — Op Note (Signed)
Date of procedure: 07/15/21 ?  ?Preoperative diagnosis:  ?Right UPJ stone ?Bladder stone ?  ?Postoperative diagnosis:  ?Right UPJ stone ?Right UVJ stone ?  ?Procedure: ?Right ureteroscopy with laser lithotripsy ?Right retrograde pyelogram ?Right ureteral stent placement ?Interpretation of fluoroscopy less than 30 minutes ?  ?Surgeon: Hollice Espy, MD ?  ?Anesthesia: General ?  ?Complications: None ?  ?Intraoperative findings: Large 1.8 cm right proximal ureteral stone relatively impacted, required angled glide to navigate wire around stone.  Stone that was felt to be a bladder actually in the right distal ureter. ?  ?EBL: Minimal ?  ?Specimens: Stone fragment ?  ?Drains: 6 x 26 French double-J ureteral stent on right ?  ?Indication: Samantha Olivera is a 71 y.o. 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  He also has chronic severe hydronephrosis on the side with significant cortical atrophy.  After reviewing the management options for treatment, he elected to proceed with the above surgical procedure(s). We have discussed the potential benefits and risks of the procedure, side effects of the proposed treatment, the likelihood of the patient achieving the goals of the procedure, and any potential problems that might occur during the procedure or recuperation. Informed consent has been obtained. ?  ?Description of procedure: ?  ?The patient was taken to the operating room and general anesthesia was induced.  The patient was placed in the dorsal lithotomy position, prepped and draped in the usual sterile fashion, and preoperative antibiotics were administered. A preoperative time-out was performed.  ?  ?A 21 [place] scope was advanced per urethra into the bladder.  Attention was turned to the right UO.  On scout imaging, the large 1.8 cm stone was seen very easily.  The bladder stone was not identified.  A sensor wire was then advanced up just distal to  the stone.  I then advanced an open-ended ureteral catheter to the proximal ureter and try to navigate the wire around the stone unsuccessfully.  I ended up using an angled Glidewire which ultimately was successful in getting around the stone.  Advanced a 5 [place] open-ended ureteral catheter around the stone into the renal pelvis and use that to exchange for a Super Stiff wire which was snapped in place as a working wire.  I then used a dual-lumen access sheath to the level of the mid ureter to introduce this to a second sensor wire which curled just proximal to the stone.  A Cook 12/14 French ureteral access sheath was then used advanced to the proximal ureter without difficulty.  The inner cannula and sensor wire were removed leaving the safety wire in place.  Next, a digital flexible ureteroscope, 8 [place] was advanced to the level of the stone.  A 200 ?m Moses fiber was used at settings of 0.3 and 120 J to dust the stone into innumerable tiny fragments.  Some of these fragments refluxed into the kidney into a midpole calyx were was further dusted into very fine like powder, no greater than the tip of the laser fiber.  Finally, retrograde pyelogram showed severe hydroureteronephrosis.  This was stable from a CT scan.  There is no contrast extravasation.  There were no filling defects.  I used this as a roadmap to ensure that each and every calyx was directly visualized and free of all significant residual stone fragment.  I then backed the scope down the length the ureter.  Unfortunately the distal ureter, I saw what  appeared to be an embedded distal ureteral stone.  I exchanged the flexible ureteroscope for a semirigid ureteroscope and was able to use 0.8 J and 10 Hz to fragment the stone which was located within a mucosal flap.  I was able to clear the entirety of this stone.  I was able to navigate easily back-and-forth through the distal ureter once all of the stone fragment had been cleared.  Contrast was  further injected and there was no contrast extravasation.  Finally, I backloaded the safety wire over a rigid cystoscope.  A 6 x 26 double-J ureteral stent was advanced over the wire up to the level of the kidney.  Upon wire withdrawal, a full coil was noted both within the renal pelvis as well as within the bladder.  The bladder was then drained.  The patient was then cleaned and dried, repositioned in the supine position, reversed from anesthesia, taken to the PACU in stable condition. ? ?Plan: We will have him keep his stent for 2 weeks and plan for cystoscopy, stent removal in the office.  We will plan for renal ultrasound 6 weeks after the stent is removed.  We may end up needing to get a Lasix renal study to assess the overall renal function on the right as well as assess for ongoing obstruction given that I anticipate the hydronephrosis may not resolve given its chronicity. ?  ?Hollice Espy, M.D. ?  ?

## 2021-07-20 LAB — CALCULI, WITH PHOTOGRAPH (CLINICAL LAB)
Calcium Oxalate Monohydrate: 100 %
Weight Calculi: 100 mg

## 2021-07-30 ENCOUNTER — Encounter: Payer: Self-pay | Admitting: Urology

## 2021-07-30 ENCOUNTER — Encounter (HOSPITAL_COMMUNITY): Payer: Self-pay | Admitting: Emergency Medicine

## 2021-07-30 ENCOUNTER — Emergency Department (HOSPITAL_COMMUNITY)
Admission: EM | Admit: 2021-07-30 | Discharge: 2021-07-30 | Disposition: A | Discharge status: Home / Self Care | Payer: Medicare Other | Attending: Emergency Medicine | Admitting: Emergency Medicine

## 2021-07-30 ENCOUNTER — Emergency Department (HOSPITAL_COMMUNITY): Payer: Medicare Other

## 2021-07-30 ENCOUNTER — Encounter: Payer: Medicare Other | Admitting: Urology

## 2021-07-30 DIAGNOSIS — E1122 Type 2 diabetes mellitus with diabetic chronic kidney disease: Secondary | ICD-10-CM | POA: Diagnosis not present

## 2021-07-30 DIAGNOSIS — R319 Hematuria, unspecified: Secondary | ICD-10-CM | POA: Diagnosis not present

## 2021-07-30 DIAGNOSIS — R10814 Left lower quadrant abdominal tenderness: Secondary | ICD-10-CM | POA: Insufficient documentation

## 2021-07-30 DIAGNOSIS — S298XXA Other specified injuries of thorax, initial encounter: Secondary | ICD-10-CM

## 2021-07-30 DIAGNOSIS — I129 Hypertensive chronic kidney disease with stage 1 through stage 4 chronic kidney disease, or unspecified chronic kidney disease: Secondary | ICD-10-CM | POA: Diagnosis not present

## 2021-07-30 DIAGNOSIS — N189 Chronic kidney disease, unspecified: Secondary | ICD-10-CM | POA: Insufficient documentation

## 2021-07-30 DIAGNOSIS — S299XXA Unspecified injury of thorax, initial encounter: Secondary | ICD-10-CM | POA: Insufficient documentation

## 2021-07-30 DIAGNOSIS — M545 Low back pain, unspecified: Secondary | ICD-10-CM | POA: Diagnosis not present

## 2021-07-30 DIAGNOSIS — R413 Other amnesia: Secondary | ICD-10-CM | POA: Insufficient documentation

## 2021-07-30 DIAGNOSIS — Z7982 Long term (current) use of aspirin: Secondary | ICD-10-CM | POA: Insufficient documentation

## 2021-07-30 DIAGNOSIS — M546 Pain in thoracic spine: Secondary | ICD-10-CM | POA: Insufficient documentation

## 2021-07-30 DIAGNOSIS — I160 Hypertensive urgency: Secondary | ICD-10-CM | POA: Diagnosis not present

## 2021-07-30 DIAGNOSIS — Z7984 Long term (current) use of oral hypoglycemic drugs: Secondary | ICD-10-CM | POA: Diagnosis not present

## 2021-07-30 DIAGNOSIS — R10812 Left upper quadrant abdominal tenderness: Secondary | ICD-10-CM | POA: Insufficient documentation

## 2021-07-30 DIAGNOSIS — Z79899 Other long term (current) drug therapy: Secondary | ICD-10-CM | POA: Diagnosis not present

## 2021-07-30 DIAGNOSIS — Y92410 Unspecified street and highway as the place of occurrence of the external cause: Secondary | ICD-10-CM | POA: Diagnosis not present

## 2021-07-30 LAB — URINALYSIS, ROUTINE W REFLEX MICROSCOPIC
Bilirubin Urine: NEGATIVE
Glucose, UA: 50 mg/dL — AB
Ketones, ur: NEGATIVE mg/dL
Nitrite: NEGATIVE
Protein, ur: 100 mg/dL — AB
RBC / HPF: >50 RBC/hpf — ABNORMAL HIGH (ref 0–5)
Specific Gravity, Urine: 1.014 (ref 1.005–1.030)
pH: 6 (ref 5.0–8.0)

## 2021-07-30 LAB — I-STAT CHEM 8, ED
BUN: 49 mg/dL — ABNORMAL HIGH (ref 8–23)
Calcium, Ion: 1.22 mmol/L (ref 1.15–1.40)
Chloride: 109 mmol/L (ref 98–111)
Creatinine, Ser: 3.5 mg/dL — ABNORMAL HIGH (ref 0.61–1.24)
Glucose, Bld: 83 mg/dL (ref 70–99)
HCT: 35 % — ABNORMAL LOW (ref 39.0–52.0)
Hemoglobin: 11.9 g/dL — ABNORMAL LOW (ref 13.0–17.0)
Potassium: 4.8 mmol/L (ref 3.5–5.1)
Sodium: 139 mmol/L (ref 135–145)
TCO2: 24 mmol/L (ref 22–32)

## 2021-07-30 LAB — COMPREHENSIVE METABOLIC PANEL
ALT: 22 U/L (ref 0–44)
AST: 34 U/L (ref 15–41)
Albumin: 3.5 g/dL (ref 3.5–5.0)
Alkaline Phosphatase: 44 U/L (ref 38–126)
Anion gap: 7 (ref 5–15)
BUN: 33 mg/dL — ABNORMAL HIGH (ref 8–23)
CO2: 22 mmol/L (ref 22–32)
Calcium: 9.4 mg/dL (ref 8.9–10.3)
Chloride: 107 mmol/L (ref 98–111)
Creatinine, Ser: 3.25 mg/dL — ABNORMAL HIGH (ref 0.61–1.24)
GFR, Estimated: 20 mL/min — ABNORMAL LOW (ref >60)
Glucose, Bld: 89 mg/dL (ref 70–99)
Potassium: 4.9 mmol/L (ref 3.5–5.1)
Sodium: 136 mmol/L (ref 135–145)
Total Bilirubin: 0.8 mg/dL (ref 0.3–1.2)
Total Protein: 7 g/dL (ref 6.5–8.1)

## 2021-07-30 LAB — CBC
HCT: 34.5 % — ABNORMAL LOW (ref 39.0–52.0)
Hemoglobin: 11 g/dL — ABNORMAL LOW (ref 13.0–17.0)
MCH: 23.2 pg — ABNORMAL LOW (ref 26.0–34.0)
MCHC: 31.9 g/dL (ref 30.0–36.0)
MCV: 72.8 fL — ABNORMAL LOW (ref 80.0–100.0)
Platelets: 232 10*3/uL (ref 150–400)
RBC: 4.74 MIL/uL (ref 4.22–5.81)
RDW: 15.4 % (ref 11.5–15.5)
WBC: 3.7 10*3/uL — ABNORMAL LOW (ref 4.0–10.5)
nRBC: 0 % (ref 0.0–0.2)

## 2021-07-30 LAB — SAMPLE TO BLOOD BANK

## 2021-07-30 LAB — PROTIME-INR
INR: 1 (ref 0.8–1.2)
Prothrombin Time: 13.5 seconds (ref 11.4–15.2)

## 2021-07-30 LAB — ETHANOL: Alcohol, Ethyl (B): <10 mg/dL (ref <10)

## 2021-07-30 MED ORDER — AMLODIPINE BESYLATE 10 MG PO TABS: 10.0000 mg | ORAL_TABLET | Freq: Every day | ORAL | 0 refills | Status: DC

## 2021-07-30 MED ORDER — OXYCODONE HCL 5 MG PO TABS
5.0000 mg | ORAL_TABLET | Freq: Once | ORAL | Status: AC
  Administered 2021-07-30: 5 mg via ORAL
  Filled 2021-07-30: qty 1

## 2021-07-30 MED ORDER — HYDROMORPHONE HCL 1 MG/ML IJ SOLN
1.0000 mg | Freq: Once | INTRAMUSCULAR | Status: AC
  Administered 2021-07-30: 1 mg via INTRAVENOUS
  Filled 2021-07-30: qty 1

## 2021-07-30 MED ORDER — ACETAMINOPHEN 500 MG PO TABS
1000.0000 mg | ORAL_TABLET | Freq: Once | ORAL | Status: AC
  Administered 2021-07-30: 1000 mg via ORAL
  Filled 2021-07-30: qty 2

## 2021-07-30 MED ORDER — SODIUM CHLORIDE 0.9 % IV BOLUS
1000.0000 mL | Freq: Once | INTRAVENOUS | Status: AC
  Administered 2021-07-30: 1000 mL via INTRAVENOUS

## 2021-07-30 MED ORDER — AMLODIPINE BESYLATE 5 MG PO TABS
10.0000 mg | ORAL_TABLET | Freq: Once | ORAL | Status: AC
  Administered 2021-07-30: 10 mg via ORAL
  Filled 2021-07-30: qty 2

## 2021-07-30 MED ORDER — OXYCODONE HCL 5 MG PO TABS: 5.0000 mg | ORAL_TABLET | ORAL | 0 refills | Status: AC | PRN
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
## 2021-07-30 NOTE — Progress Notes (Incomplete)
   07/30/2021  CC: No chief complaint on file.   HPI: Jaqua Ching is a 71 y.o. male  with a personal history of nephrolithiasis, right hydronephrosis, right renal atrophy, microscopic hematuria, and CKD stage 3, who presents today for a 2 week cystoscopy/ stent removal.   His creatinine has been trending up since 2019. In 2019 his creatinine was 1.38, it increased in 2020 to 1.83 then decreased back down to 1.38. It trended back up to 1.90 in 2021 then to 2.17 in 2022. His most recent creatinine was 2.60 on 04/04/2021.     He underwent a RUS on 05/10/2021 to further evaluate CKD. It visualized moderate to marked right hydronephrosis along with cortical thinning, possibly suggesting longstanding obstruction of right ureter. There is 8 mm calcific density in the urinary bladder which may suggest recently passed ureteral calculus or calculus at the right ureterovesical junction.   CT renal stone study on 07/09/2021 visualized a 4 mm calculus in the lower pile of left kidney. Large calculus at the right UPJ measuring 1.8 x 1 x 1.4 cm. Associated sever hydronephrosis with mild renal cortical thinning. A tiny 1-2 mm calculi in the lower pole right kidney. A 1.6 cm hypodense cyst at the lower pole right kidney, and a 6 mm calculus within the posterior right urinary bladder.   He is s/p ureteroscopy with laser lithotripsy, right retrograde pyelogram, and right ureteral stent placement on 07/15/2021. Intraoperative findings: Large 1.8 cm right proximal ureteral stone relatively impacted, required angled glide to navigate wire around stone.  Stone that was felt to be a bladder actually in the right distal ureter.   Stone analysis was 100% calcium oxalate monohydrate.   There were no vitals filed for this visit. NED. A&Ox3.   No respiratory distress   Abd soft, NT, ND Normal phallus with bilateral descended testicles  Cystoscopy/ Stent removal procedure  Patient identification was confirmed, informed  consent was obtained, and patient was prepped using Betadine solution.  Lidocaine jelly was administered per urethral meatus.    Preoperative abx where received prior to procedure.    Procedure: - Flexible cystoscope introduced, without any difficulty.   - Thorough search of the bladder revealed:    normal urethral meatus  Stent seen emanating from {Blank single:19197::"left" "right"} ureteral orifice, grasped with stent graspers, and removed in entirety.    *** Moderate debris in bladder limiting visualization.  Post-Procedure: - Patient tolerated the procedure well  Assessment & Plan

## 2021-07-30 NOTE — Discharge Instructions (Addendum)
If you develop new or worsening chest pain, back pain, abdominal pain, or if you develop a headache, vomiting, vision changes, shortness of breath, or any other new/concerning symptoms then return to the ER for evaluation.  Your blood pressure is quite elevated today and you will need to follow-up closely with your primary care physician and nephrologist about your high blood pressure and kidney disease.

## 2021-07-30 NOTE — ED Notes (Signed)
Pt transported to CT ?

## 2021-07-30 NOTE — ED Notes (Signed)
Called pts wife per pt request, no answer, left message
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
## 2021-07-31 ENCOUNTER — Emergency Department (HOSPITAL_COMMUNITY)
Admission: EM | Admit: 2021-07-31 | Discharge: 2021-08-01 | Disposition: A | Discharge status: Home / Self Care | Payer: Medicare Other | Attending: Emergency Medicine | Admitting: Emergency Medicine

## 2021-07-31 ENCOUNTER — Other Ambulatory Visit: Payer: Self-pay

## 2021-07-31 DIAGNOSIS — Z7982 Long term (current) use of aspirin: Secondary | ICD-10-CM | POA: Insufficient documentation

## 2021-07-31 DIAGNOSIS — R42 Dizziness and giddiness: Secondary | ICD-10-CM | POA: Insufficient documentation

## 2021-07-31 DIAGNOSIS — Z7984 Long term (current) use of oral hypoglycemic drugs: Secondary | ICD-10-CM | POA: Diagnosis not present

## 2021-07-31 DIAGNOSIS — I10 Essential (primary) hypertension: Secondary | ICD-10-CM | POA: Insufficient documentation

## 2021-07-31 DIAGNOSIS — R519 Headache, unspecified: Secondary | ICD-10-CM | POA: Diagnosis present

## 2021-07-31 LAB — CBC WITH DIFFERENTIAL/PLATELET
Abs Immature Granulocytes: 0.01 10*3/uL (ref 0.00–0.07)
Basophils Absolute: 0.1 10*3/uL (ref 0.0–0.1)
Basophils Relative: 1 %
Eosinophils Absolute: 0.1 10*3/uL (ref 0.0–0.5)
Eosinophils Relative: 2 %
HCT: 37.1 % — ABNORMAL LOW (ref 39.0–52.0)
Hemoglobin: 11.5 g/dL — ABNORMAL LOW (ref 13.0–17.0)
Immature Granulocytes: 0 %
Lymphocytes Relative: 37 %
Lymphs Abs: 2.2 10*3/uL (ref 0.7–4.0)
MCH: 22.5 pg — ABNORMAL LOW (ref 26.0–34.0)
MCHC: 31 g/dL (ref 30.0–36.0)
MCV: 72.6 fL — ABNORMAL LOW (ref 80.0–100.0)
Monocytes Absolute: 0.5 10*3/uL (ref 0.1–1.0)
Monocytes Relative: 9 %
Neutro Abs: 3 10*3/uL (ref 1.7–7.7)
Neutrophils Relative %: 51 %
Platelets: 262 10*3/uL (ref 150–400)
RBC: 5.11 MIL/uL (ref 4.22–5.81)
RDW: 15.2 % (ref 11.5–15.5)
WBC: 5.9 10*3/uL (ref 4.0–10.5)
nRBC: 0 % (ref 0.0–0.2)

## 2021-07-31 LAB — I-STAT CHEM 8, ED
BUN: 29 mg/dL — ABNORMAL HIGH (ref 8–23)
Calcium, Ion: 1.23 mmol/L (ref 1.15–1.40)
Chloride: 107 mmol/L (ref 98–111)
Creatinine, Ser: 3.4 mg/dL — ABNORMAL HIGH (ref 0.61–1.24)
Glucose, Bld: 123 mg/dL — ABNORMAL HIGH (ref 70–99)
HCT: 36 % — ABNORMAL LOW (ref 39.0–52.0)
Hemoglobin: 12.2 g/dL — ABNORMAL LOW (ref 13.0–17.0)
Potassium: 4.5 mmol/L (ref 3.5–5.1)
Sodium: 140 mmol/L (ref 135–145)
TCO2: 22 mmol/L (ref 22–32)

## 2021-07-31 LAB — BRAIN NATRIURETIC PEPTIDE: B Natriuretic Peptide: 26.3 pg/mL (ref 0.0–100.0)

## 2021-07-31 MED ORDER — AMLODIPINE BESYLATE 5 MG PO TABS
10.0000 mg | ORAL_TABLET | Freq: Once | ORAL | Status: AC
  Administered 2021-07-31: 10 mg via ORAL
  Filled 2021-07-31: qty 2

## 2021-07-31 MED ORDER — HYDRALAZINE HCL 25 MG PO TABS
25.0000 mg | ORAL_TABLET | Freq: Once | ORAL | Status: AC
  Administered 2021-07-31: 25 mg via ORAL
  Filled 2021-07-31: qty 1

## 2021-07-31 MED ORDER — ONDANSETRON 4 MG PO TBDP: 8.0000 mg | ORAL_TABLET | Freq: Three times a day (TID) | ORAL | Status: DC | PRN

## 2021-07-31 MED ORDER — ACETAMINOPHEN 500 MG PO TABS
1000.0000 mg | ORAL_TABLET | Freq: Once | ORAL | Status: AC
  Administered 2021-07-31: 1000 mg via ORAL
  Filled 2021-07-31: qty 2

## 2021-07-31 MED ORDER — CARVEDILOL 3.125 MG PO TABS
3.1250 mg | ORAL_TABLET | Freq: Once | ORAL | Status: AC
  Administered 2021-07-31: 3.125 mg via ORAL
  Filled 2021-07-31: qty 1

## 2021-07-31 MED ORDER — HYDRALAZINE HCL 20 MG/ML IJ SOLN: 10.0000 mg | Freq: Once | INTRAMUSCULAR | Status: DC

## 2021-07-31 NOTE — ED Provider Triage Note (Signed)
Emergency Medicine Provider Triage Evaluation Note  Jeremy Pacheco , a 71 y.o. male  was evaluated in triage.  Pt complains of blurry vision, nausea.  Patient was in [facility] yesterday seen, evaluated, notes that on discharge she was sent home with narcotics, has been taking this for pain control and when he is not doing so has substantial pain.  However, he has had worsening blurry vision, nausea and unsettled sensation throughout the day without focal weakness, syncope, fall.  Review of Systems  Positive: Per HPI Negative: Fall, trauma, confusion, focal weakness  Physical Exam  BP (!) 199/104 (BP Location: Right Arm)   Pulse 63   Temp 98.2 F (36.8 C) (Oral)   Resp 20   SpO2 100%  Gen:   Awake, no distress speaking clearly Resp:  Normal effort no increased work of breathing MSK:   Moves extremities without difficulty  Other:  Neuro grossly unremarkable  Medical Decision Making  Medically screening exam initiated at 7:50 PM.  Appropriate orders placed.  Jeremy Pacheco was informed that the remainder of the evaluation will be completed by another provider, this initial triage assessment does not replace that evaluation, and the importance of remaining in the ED until their evaluation is complete.   Jeremy Muskrat, MD 07/31/21 [phone]
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
## 2021-08-01 LAB — BASIC METABOLIC PANEL
Anion gap: 7 (ref 5–15)
BUN: 29 mg/dL — ABNORMAL HIGH (ref 8–23)
CO2: 22 mmol/L (ref 22–32)
Calcium: 9.6 mg/dL (ref 8.9–10.3)
Chloride: 110 mmol/L (ref 98–111)
Creatinine, Ser: 3.05 mg/dL — ABNORMAL HIGH (ref 0.61–1.24)
GFR, Estimated: 22 mL/min — ABNORMAL LOW (ref >60)
Glucose, Bld: 124 mg/dL — ABNORMAL HIGH (ref 70–99)
Potassium: 4.4 mmol/L (ref 3.5–5.1)
Sodium: 139 mmol/L (ref 135–145)

## 2021-08-01 MED ORDER — CARVEDILOL 3.125 MG PO TABS: 3.1250 mg | ORAL_TABLET | Freq: Two times a day (BID) | ORAL | 0 refills | Status: AC

## 2021-08-01 MED ORDER — AMLODIPINE BESYLATE 10 MG PO TABS: 10.0000 mg | ORAL_TABLET | Freq: Every day | ORAL | 0 refills | Status: AC

## 2021-08-01 NOTE — Discharge Instructions (Addendum)
Your EKG and laboratory work-up was reassuring today.  Your blood pressure improved with all medications orally.  Recommend you follow-up with your PCP for continued home blood pressure management outpatient.  Your home antihypertensives have been reordered for 30 days supply.

## 2021-08-06 ENCOUNTER — Ambulatory Visit (INDEPENDENT_AMBULATORY_CARE_PROVIDER_SITE_OTHER): Payer: Medicare Other | Admitting: Urology

## 2021-08-06 VITALS — BP 164/98 | HR 71 | Ht 70.0 in | Wt 205.0 lb

## 2021-08-06 DIAGNOSIS — N200 Calculus of kidney: Secondary | ICD-10-CM

## 2021-08-06 LAB — URINALYSIS, COMPLETE
Bilirubin, UA: NEGATIVE
Ketones, UA: NEGATIVE
Nitrite, UA: NEGATIVE
Specific Gravity, UA: 1.02 (ref 1.005–1.030)
Urobilinogen, Ur: 0.2 mg/dL (ref 0.2–1.0)
pH, UA: 6 (ref 5.0–7.5)

## 2021-08-06 LAB — MICROSCOPIC EXAMINATION: RBC, Urine: >30 /hpf — AB (ref 0–2)

## 2021-08-06 MED ORDER — SULFAMETHOXAZOLE-TRIMETHOPRIM 800-160 MG PO TABS
1.0000 | ORAL_TABLET | Freq: Once | ORAL | Status: AC
  Administered 2021-08-06: 1 via ORAL
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
## 2021-09-18 ENCOUNTER — Ambulatory Visit: Payer: Medicare Other | Admitting: Urology

## 2021-10-17 LAB — EXTERNAL GENERIC LAB PROCEDURE

## 2021-10-31 LAB — EXTERNAL GENERIC LAB PROCEDURE: COLOGUARD: POSITIVE — AB

## 2022-12-05 ENCOUNTER — Emergency Department (HOSPITAL_COMMUNITY): Payer: Medicare Other

## 2022-12-05 ENCOUNTER — Encounter (HOSPITAL_COMMUNITY): Payer: Self-pay

## 2022-12-05 ENCOUNTER — Other Ambulatory Visit: Payer: Self-pay

## 2022-12-05 ENCOUNTER — Emergency Department (HOSPITAL_COMMUNITY)
Admission: EM | Admit: 2022-12-05 | Discharge: 2022-12-05 | Disposition: A | Discharge status: Home / Self Care | Payer: Medicare Other

## 2022-12-05 DIAGNOSIS — Z79899 Other long term (current) drug therapy: Secondary | ICD-10-CM | POA: Diagnosis not present

## 2022-12-05 DIAGNOSIS — Z7982 Long term (current) use of aspirin: Secondary | ICD-10-CM | POA: Diagnosis not present

## 2022-12-05 DIAGNOSIS — I10 Essential (primary) hypertension: Secondary | ICD-10-CM | POA: Diagnosis not present

## 2022-12-05 DIAGNOSIS — Z7984 Long term (current) use of oral hypoglycemic drugs: Secondary | ICD-10-CM | POA: Insufficient documentation

## 2022-12-05 DIAGNOSIS — E119 Type 2 diabetes mellitus without complications: Secondary | ICD-10-CM | POA: Diagnosis not present

## 2022-12-05 DIAGNOSIS — R0602 Shortness of breath: Secondary | ICD-10-CM | POA: Diagnosis not present

## 2022-12-05 DIAGNOSIS — R079 Chest pain, unspecified: Secondary | ICD-10-CM | POA: Insufficient documentation

## 2022-12-05 LAB — BASIC METABOLIC PANEL
Anion gap: 7 (ref 5–15)
BUN: 31 mg/dL — ABNORMAL HIGH (ref 8–23)
CO2: 23 mmol/L (ref 22–32)
Calcium: 9.8 mg/dL (ref 8.9–10.3)
Chloride: 106 mmol/L (ref 98–111)
Creatinine, Ser: 2.92 mg/dL — ABNORMAL HIGH (ref 0.61–1.24)
GFR, Estimated: 23 mL/min — ABNORMAL LOW (ref >60)
Glucose, Bld: 195 mg/dL — ABNORMAL HIGH (ref 70–99)
Potassium: 4 mmol/L (ref 3.5–5.1)
Sodium: 136 mmol/L (ref 135–145)

## 2022-12-05 LAB — CBC
HCT: 37.9 % — ABNORMAL LOW (ref 39.0–52.0)
Hemoglobin: 11.7 g/dL — ABNORMAL LOW (ref 13.0–17.0)
MCH: 22.4 pg — ABNORMAL LOW (ref 26.0–34.0)
MCHC: 30.9 g/dL (ref 30.0–36.0)
MCV: 72.5 fL — ABNORMAL LOW (ref 80.0–100.0)
Platelets: 228 10*3/uL (ref 150–400)
RBC: 5.23 MIL/uL (ref 4.22–5.81)
RDW: 15.8 % — ABNORMAL HIGH (ref 11.5–15.5)
WBC: 5.1 10*3/uL (ref 4.0–10.5)
nRBC: 0 % (ref 0.0–0.2)

## 2022-12-05 LAB — TROPONIN I (HIGH SENSITIVITY)
Troponin I (High Sensitivity): 3 ng/L (ref <18)
Troponin I (High Sensitivity): 4 ng/L (ref <18)

## 2022-12-05 MED ORDER — AMLODIPINE BESYLATE 5 MG PO TABS
10.0000 mg | ORAL_TABLET | Freq: Once | ORAL | Status: AC
  Administered 2022-12-05: 10 mg via ORAL
  Filled 2022-12-05: qty 2

## 2022-12-05 MED ORDER — CARVEDILOL 3.125 MG PO TABS: 3.1250 mg | ORAL_TABLET | Freq: Two times a day (BID) | ORAL | Status: DC

## 2022-12-05 NOTE — ED Provider Notes (Signed)
[place] EMERGENCY DEPARTMENT AT [facility] Provider Note   CSN: [id number] Arrival date & time: 12/05/22  1432     History  Chief Complaint  Patient presents with   Chest Pain   HPI Eythan Jayne is a 72 y.o. male with history of hypertension, diabetes and reports angina presenting for chest pain.  Started last night just after he cracked his weedeater and was doing some weeding around the house.  Felt like a sharp stabbing pain in the left side of his chest.  It was nonradiating and nonpleuritic.  Had some associated shortness of breath.  States his symptoms lasted for about 30 minutes and completely went away.  Did not take any medications at that time.  At this time he is asymptomatic.  Wanted to be "checked out" because he does report a history of angina in the past.  Denies cough and fever.  Denies recent immobilization.   Chest Pain      Home Medications Prior to Admission medications   Medication Sig Start Date End Date Taking? Authorizing Provider  ALPRAZolam (XANAX XR) 1 MG 24 hr tablet Take 1 mg by mouth in the morning.    [provider]  amLODipine (NORVASC) 10 MG tablet Take 1 tablet (10 mg total) by mouth daily. 08/01/21 08/31/21  Ernie Avena, MD  aspirin EC 81 MG tablet Take 81 mg by mouth daily. Swallow whole.    [provider]  atorvastatin (LIPITOR) 20 MG tablet Take 20 mg by mouth daily.    [provider]  carvedilol (COREG) 3.125 MG tablet Take 1 tablet (3.125 mg total) by mouth 2 (two) times daily with a meal. 08/01/21 08/31/21  Ernie Avena, MD  cyclobenzaprine (FLEXERIL) 10 MG tablet Take 10 mg by mouth 2 (two) times daily as needed. 08/01/21   [provider]  glipiZIDE (GLUCOTROL XL) 10 MG 24 hr tablet Take 10 mg by mouth daily with breakfast.    [provider]  metFORMIN (GLUCOPHAGE) 500 MG tablet Take 500 mg by mouth 2 (two) times daily.    [provider]  Omega-3 300 MG CAPS Take 300 mg by  mouth daily.    [provider]  oxyCODONE (ROXICODONE) 5 MG immediate release tablet Take 1 tablet (5 mg total) by mouth every 4 (four) hours as needed for severe pain. 07/30/21   Pricilla Loveless, MD  oxyCODONE-acetaminophen (PERCOCET) 5-325 MG tablet Take 1-2 tablets by mouth every 4 (four) hours as needed for moderate pain or severe pain. 07/15/21   Vanna Scotland, MD  traZODone (DESYREL) 100 MG tablet Take 100 mg by mouth at bedtime.    [provider]      Allergies    Penicillins and Lisinopril    Review of Systems   Review of Systems  Cardiovascular:  Positive for chest pain.    Physical Exam   Vitals:   12/05/22 1911 12/05/22 1913  BP: (!) 201/106 (!) 203/105  Pulse:  61  Resp:    Temp:    SpO2:  99%    CONSTITUTIONAL:  well-appearing, NAD NEURO:  Alert and oriented x 3, CN 3-12 grossly intact EYES:  eyes equal and reactive ENT/NECK:  Supple, no stridor  CARDIO:  Regular rate and rhythm, appears well-perfused  PULM:  No respiratory distress, CTAB GI/GU:  non-distended, soft MSK/SPINE:  No gross deformities, no edema, moves all extremities  SKIN:  no rash, atraumatic  *Additional and/or pertinent findings included in MDM below  ED Results / Procedures / Treatments   Labs (all labs ordered are listed, but only abnormal results are displayed) Labs Reviewed  BASIC METABOLIC PANEL - Abnormal; Notable for the following components:      Result Value   Glucose, Bld 195 (*)    BUN 31 (*)    Creatinine, Ser 2.92 (*)    GFR, Estimated 23 (*)    All other components within normal limits  CBC - Abnormal; Notable for the following components:   Hemoglobin 11.7 (*)    HCT 37.9 (*)    MCV 72.5 (*)    MCH 22.4 (*)    RDW 15.8 (*)    All other components within normal limits  TROPONIN I (HIGH SENSITIVITY)  TROPONIN I (HIGH SENSITIVITY)    EKG EKG Interpretation Date/Time:  Friday December 05 2022 14:44:55 EDT Ventricular Rate:  59 PR  Interval:  246 QRS Duration:  95 QT Interval:  415 QTC Calculation: 412 R Axis:   27  Text Interpretation: Sinus rhythm Prolonged PR interval Abnormal R-wave progression, early transition Confirmed by Beckey Downing [phone]) on 12/05/2022 7:29:21 PM  Radiology DG Chest 2 View  Result Date: 12/05/2022 CLINICAL DATA:  Chest pain. EXAM: CHEST - 2 VIEW COMPARISON:  Jul 30, 2021. FINDINGS: The heart size and mediastinal contours are within normal limits. Both lungs are clear. The visualized skeletal structures are unremarkable. IMPRESSION: No active cardiopulmonary disease. Electronically Signed   By: Lupita Raider M.D.   On: 12/05/2022 16:44    Procedures Procedures    Medications Ordered in ED Medications  carvedilol (COREG) tablet 3.125 mg (has no administration in time range)  amLODipine (NORVASC) tablet 10 mg (10 mg Oral Given 12/05/22 1922)    ED Course/ Medical Decision Making/ A&P             HEART Score: 3                    Medical Decision Making Amount and/or Complexity of Data Reviewed Labs: ordered. Radiology: ordered.  Risk Prescription drug management.   Initial Impression and Ddx 72 year old well-appearing male presenting for chest pain.  Exam was unremarkable.  DDx includes ACS, PE, dissection, pneumothorax, pneumonia, CHF exacerbation or other. Patient PMH that increases complexity of ED encounter:  hypertension, diabetes   Interpretation of Diagnostics - I independent reviewed and interpreted the labs as followed: Reduced GFR but near baseline, mild anemia  - I independently visualized the following imaging with scope of interpretation limited to determining acute life threatening conditions related to emergency care: cxr, which revealed no acute findings  -I personally reviewed and interpreted EKG which revealed sinus rhythm  Patient Reassessment and Ultimate Disposition/Management On serial reassessments, patient remained asymptomatic without chest pain  throughout encounter.  Blood pressure was noted to be elevated but patient did not report any new symptoms suggestive of hypertensive emergency.  Treated with his home medications amlodipine and carvedilol.  Given the exertional chest pain he had yesterday with cardiac risk factors, could be angina.  But overall looks well and hemodynamically stable and without symptoms at this time.  Heart score is 3.  Feel he is appropriate for discharge with ambulatory referral to cardiology.  Discussed return precautions.  Vital stable.  Discharged home in good condition.  Patient management required discussion with the following services or consulting groups:  None  Complexity of Problems Addressed Acute complicated illness or Injury  Additional Data Reviewed and Analyzed Further history obtained from:  Past medical history and medications listed in the EMR, Prior ED visit notes, and Recent discharge summary  Patient Encounter Risk Assessment None         Final Clinical Impression(s) / ED Diagnoses Final diagnoses:  Chest pain, unspecified type    Rx / DC Orders ED Discharge Orders          Ordered    Ambulatory referral to Cardiology       Comments: If you have not heard from the Cardiology office within the next 72 hours please call [phone].   12/05/22 1932              Gareth Eagle, PA-C 12/05/22 1932    Durwin Glaze, MD 12/05/22 [phone]

## 2022-12-05 NOTE — ED Triage Notes (Addendum)
Pt complaining of intermittent chest pain that has been present since yesterday after mowing the lawn. Denies any N/V, or radiation. Pt has hx of angina.

## 2022-12-05 NOTE — Discharge Instructions (Addendum)
Evaluation today was overall reassuring.  However I do recommend that you follow-up with cardiology given your exertional chest pain you had yesterday along with your risk factors for heart disease.  If your chest pain returns, you have new shortness of breath, calf tenderness or any other concerning symptom please return emergency department further evaluation.

## 2022-12-05 NOTE — ED Notes (Signed)
Sent light blue and red top to lab.

## 2023-02-08 NOTE — Progress Notes (Unsigned)
  Cardiology Office Note:  .   Date:  02/08/2023  ID:  Jeremy Pacheco, DOB [birth year], MRN [id number] PCP: Tracey Harries, MD  [facility] Providers Cardiologist:  None { Click to update primary MD,subspecialty MD or APP then REFRESH:1}   History of Present Illness: Jeremy Pacheco is a 72 y.o. male with history of CKD IV, DM, HTN, HLD who presents for the evaluation of chest pain at the request of Tracey Harries, MD. Seen in ER in October for CP. EKG normal and troponin negative x 2.      Problem List CKD IV DM -A1c 7.2 HTN HLD -T chol 160, HDL 39, LDL 83, TG 230    ROS: All other ROS reviewed and negative. Pertinent positives noted in the HPI.     Studies Reviewed: Marland Kitchen       Physical Exam:   VS:  There were no vitals taken for this visit.   Wt Readings from Last 3 Encounters:  12/05/22 204 lb (92.5 kg)  08/06/21 205 lb (93 kg)  07/30/21 205 lb (93 kg)    GEN: Well nourished, well developed in no acute distress NECK: No JVD; No carotid bruits CARDIAC: ***RRR, no murmurs, rubs, gallops RESPIRATORY:  Clear to auscultation without rales, wheezing or rhonchi  ABDOMEN: Soft, non-tender, non-distended EXTREMITIES:  No edema; No deformity  ASSESSMENT AND PLAN: .   ***    {Are you ordering a CV Procedure (e.g. stress test, cath, DCCV, TEE, etc)?   Press F2        :[id number]}   Follow-up: No follow-ups on file.  Time Spent with Patient: I have spent a total of *** minutes caring for this patient today face to face, ordering and reviewing labs/tests, reviewing prior records/medical history, examining the patient, establishing an assessment and plan, communicating results/findings to the patient/family, and documenting in the medical record.   Signed, Lenna Gilford. Flora Lipps, MD, [facility]  [facility]  [address], [address] [place], [place] [address] [phone]  7:54 PM

## 2023-02-09 ENCOUNTER — Ambulatory Visit: Payer: Medicare Other | Attending: Cardiovascular Disease | Admitting: Cardiovascular Disease

## 2023-02-09 DIAGNOSIS — R072 Precordial pain: Secondary | ICD-10-CM
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
